# Patient Record
Sex: Female | Born: 2000 | Race: White | Hispanic: No | Smoking: Current some day smoker
Health system: Southern US, Community
[De-identification: ages and names within clinical notes are randomized; demographics above are authoritative.]

## PROBLEM LIST (undated history)

## (undated) DIAGNOSIS — O24419 Gestational diabetes mellitus in pregnancy, unspecified control: Secondary | ICD-10-CM

## (undated) HISTORY — DX: Gestational diabetes mellitus in pregnancy, unspecified control: O24.419

## (undated) HISTORY — PX: MOUTH SURGERY: SHX715

## (undated) HISTORY — PX: FRACTURE SURGERY: SHX138

## (undated) HISTORY — PX: WISDOM TOOTH EXTRACTION: SHX21

---
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
## 2018-03-07 ENCOUNTER — Encounter: Payer: Self-pay | Admitting: *Deleted

## 2018-03-07 ENCOUNTER — Ambulatory Visit (INDEPENDENT_AMBULATORY_CARE_PROVIDER_SITE_OTHER): Payer: Medicaid Other | Admitting: *Deleted

## 2018-03-07 ENCOUNTER — Ambulatory Visit: Payer: Medicaid Other

## 2018-03-07 ENCOUNTER — Other Ambulatory Visit: Payer: Self-pay | Admitting: Adult Health

## 2018-03-07 DIAGNOSIS — Z308 Encounter for other contraceptive management: Secondary | ICD-10-CM

## 2018-03-07 DIAGNOSIS — Z3042 Encounter for surveillance of injectable contraceptive: Secondary | ICD-10-CM

## 2018-03-07 DIAGNOSIS — Z3202 Encounter for pregnancy test, result negative: Secondary | ICD-10-CM

## 2018-03-07 LAB — POCT URINE PREGNANCY: PREG TEST UR: NEGATIVE

## 2018-03-07 MED ORDER — MEDROXYPROGESTERONE ACETATE 150 MG/ML IM SUSP
150.0000 mg | Freq: Once | INTRAMUSCULAR | Status: AC
  Administered 2018-03-07: 150 mg via INTRAMUSCULAR

## 2018-03-07 NOTE — Progress Notes (Signed)
Pt here for Depo. Pt tolerated shot well. Return in 12 weeks for next shot. JSY 

## 2018-05-20 ENCOUNTER — Ambulatory Visit: Payer: Medicaid Other

## 2018-05-20 ENCOUNTER — Telehealth: Payer: Self-pay | Admitting: *Deleted

## 2018-05-20 NOTE — Telephone Encounter (Signed)
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  Advised patient to see if she could have prescription transferred or pay out of pocket.  Patient stated she would call the pharmcy.

## 2019-06-30 IMAGING — US US BREAST*R* LIMITED INC AXILLA
1 series · 13 of 15 positions shown · non-contrast
Comparison: 01/05/2017

CLINICAL DATA: 15-year-old patient presents for evaluation of the
right breast. Possible mass palpated in the upper-outer quadrant of
the right breast on recent clinical physical exam. While in the
ultrasound room today, the patient shows me an area were she
palpates a lump in the 12 o'clock right breast.

She was evaluated at [HOSPITAL] in Wednesday December, 2016 for evaluation of
a benign cyst in the right breast 11 o'clock position that was
palpable at the time.
The patient denies any family history of breast cancer.
EXAM:
ULTRASOUND OF THE RIGHT BREAST

[Series 1: us breast*right* limited inc axilla · 0.07mm/px · 13 of 15 slices shown]
[im 1/15]
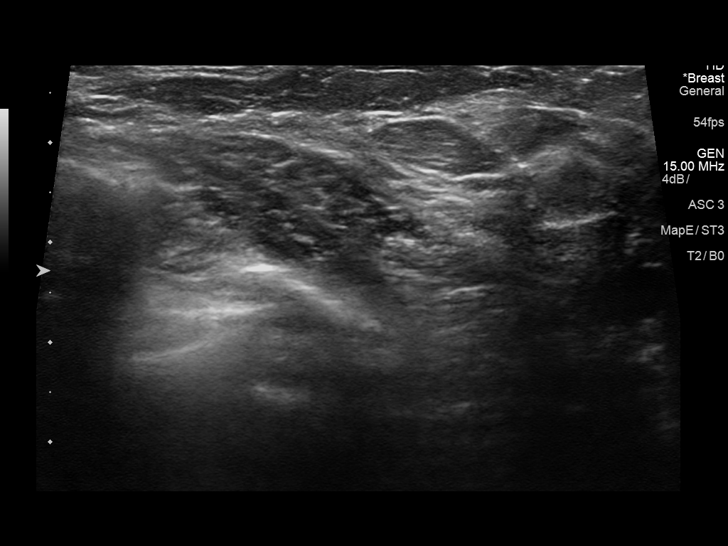
[im 2/15]
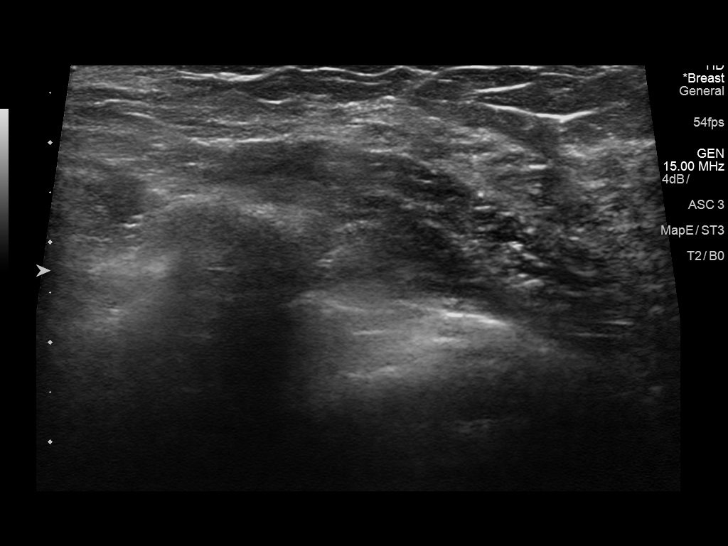
[im 3/15]
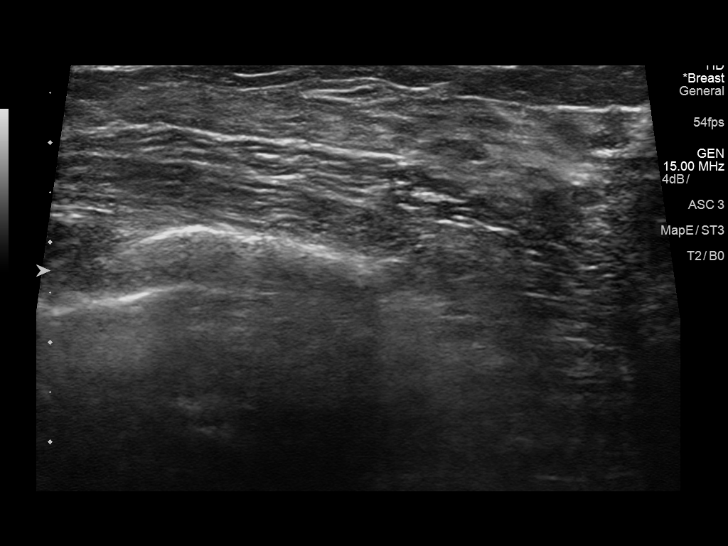
[im 5/15]
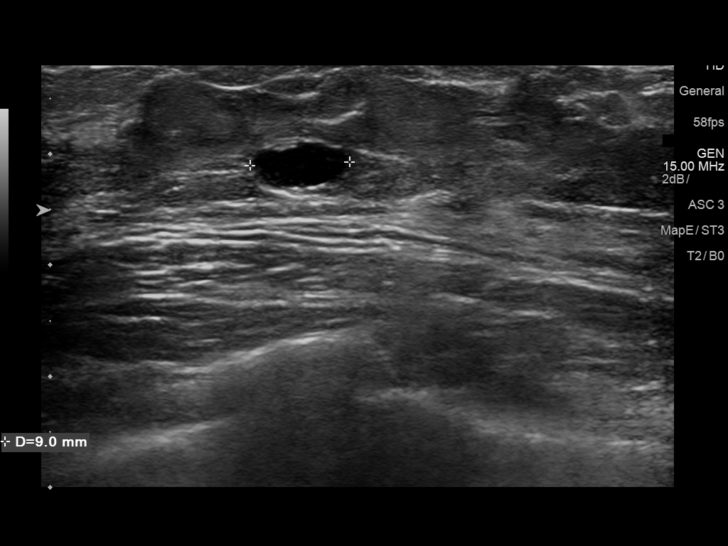
[im 6/15]
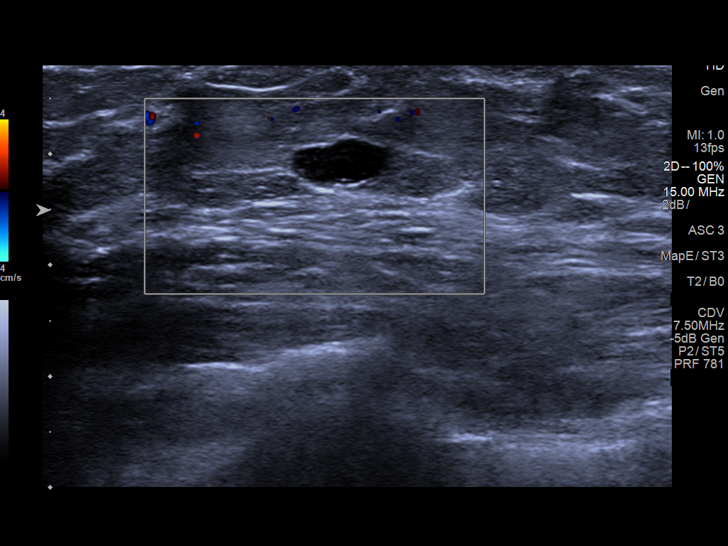
[im 7/15]
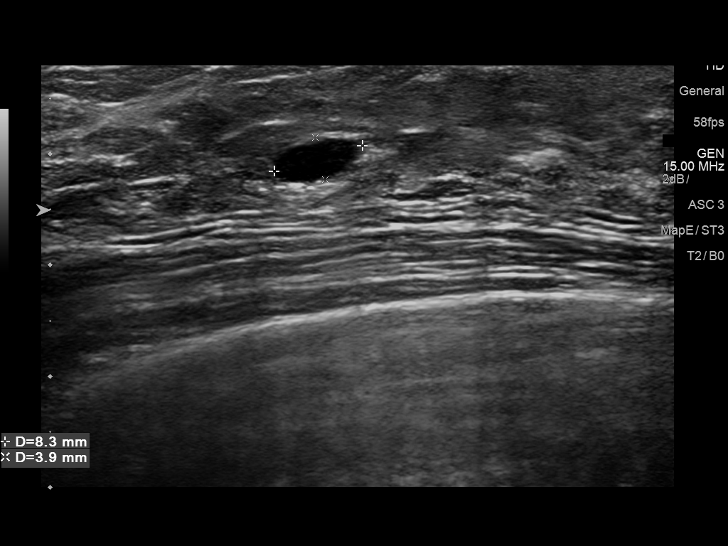
[im 8/15]
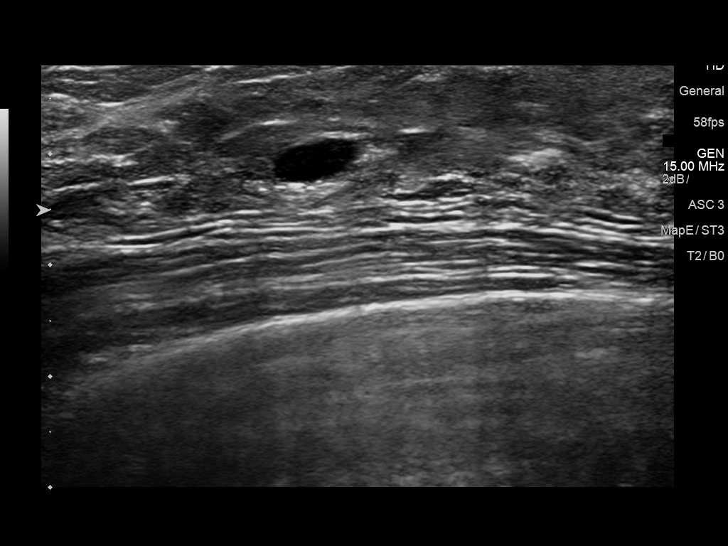
[im 9/15]
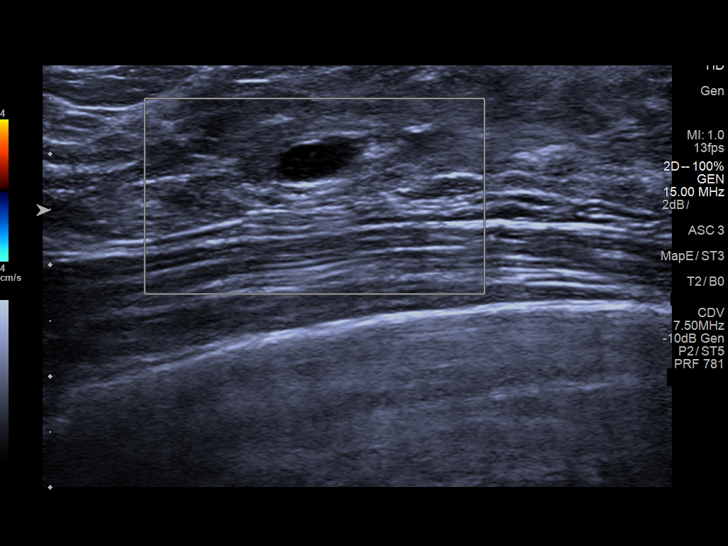
[im 10/15]
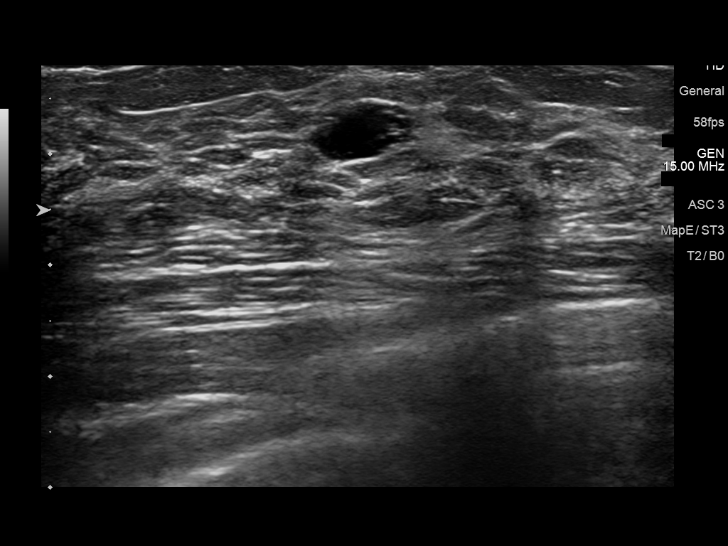
[im 11/15]
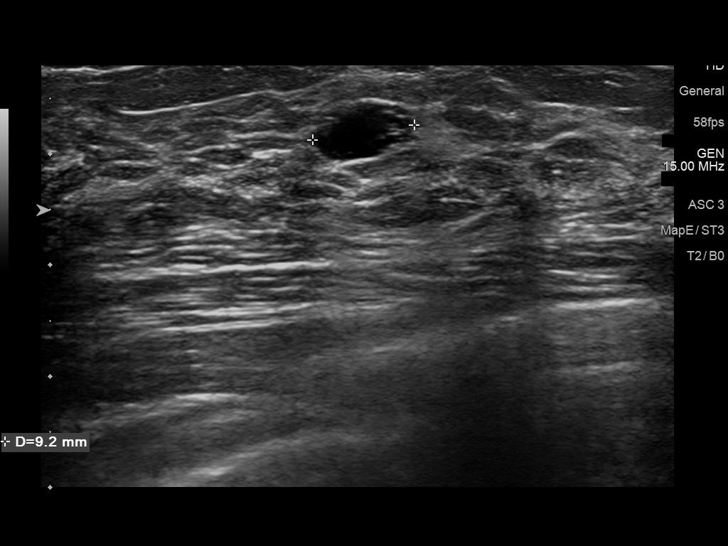
[im 13/15]
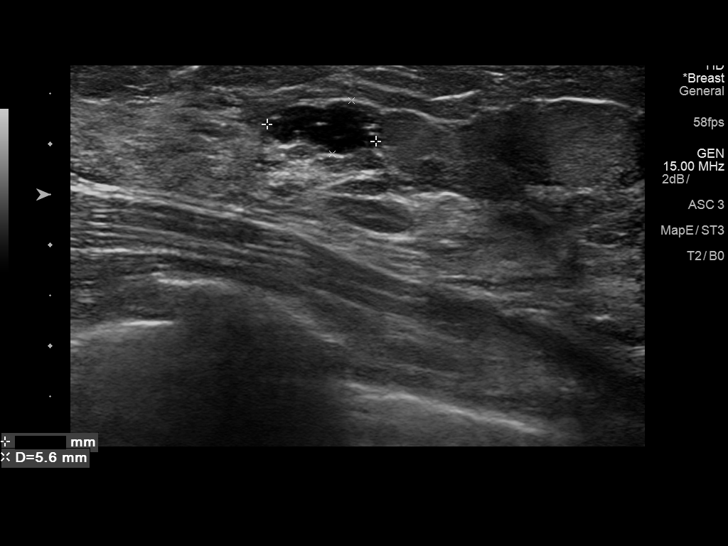
[im 14/15]
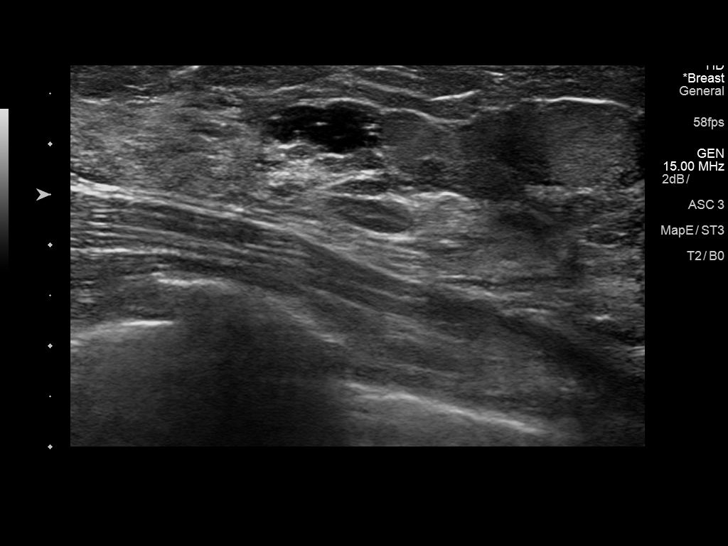
[im 15/15]
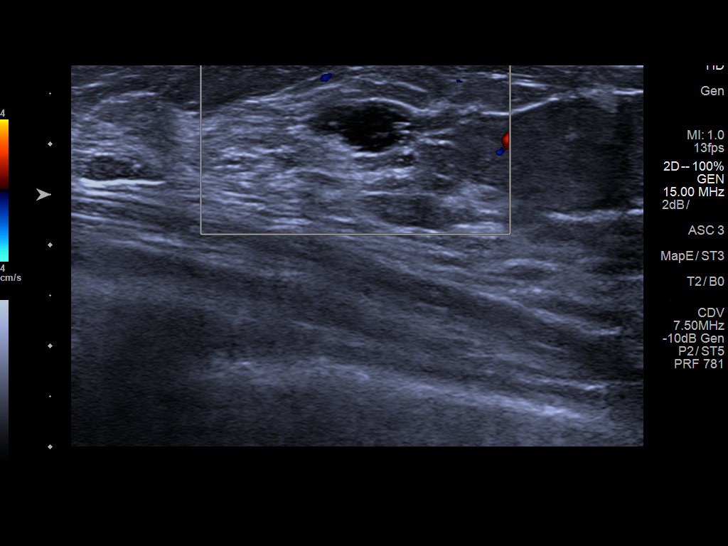

[13 of 15 positions shown; findings below may reference images not displayed]

FINDINGS: On physical exam, I do not palpate a definite discrete mass in the
upper outer or upper central right breast on physical exam today.

Targeted ultrasound is performed, showing a cyst with some internal
debris at 12 o'clock position 2 cm from the nipple measuring 1.1 x
0.6 x 0.9 cm. The patient palpates a nodule in this region. At 11
o'clock position 3 cm from the nipple is a 0.8 x 0.4 x 0.9 cm simple
cyst. Evaluation of the upper-outer quadrant of the right breast is
negative for cyst, solid mass, or abnormal shadowing.
IMPRESSION: No evidence of malignancy in the right breast. Two benign cysts at
12 o'clock and 11 o'clock positions.

RECOMMENDATION:
Screening mammogram at age 38 unless there are persistent or
intervening clinical concerns. (Code:AX-K-TDW)

I have discussed the findings and recommendations with the patient.
Results were also provided in writing at the conclusion of the
visit. If applicable, a reminder letter will be sent to the patient
regarding the next appointment.

BI-RADS CATEGORY  2: Benign.

## 2019-09-21 ENCOUNTER — Encounter (HOSPITAL_COMMUNITY): Payer: Self-pay | Admitting: Emergency Medicine

## 2019-09-21 ENCOUNTER — Other Ambulatory Visit: Payer: Self-pay

## 2019-09-21 ENCOUNTER — Emergency Department (HOSPITAL_COMMUNITY)
Admission: EM | Admit: 2019-09-21 | Discharge: 2019-09-21 | Disposition: A | Discharge status: Home / Self Care | Payer: Medicaid Other | Attending: Emergency Medicine | Admitting: Emergency Medicine

## 2019-09-21 DIAGNOSIS — F17290 Nicotine dependence, other tobacco product, uncomplicated: Secondary | ICD-10-CM | POA: Diagnosis not present

## 2019-09-21 DIAGNOSIS — Z79899 Other long term (current) drug therapy: Secondary | ICD-10-CM | POA: Insufficient documentation

## 2019-09-21 DIAGNOSIS — R350 Frequency of micturition: Secondary | ICD-10-CM | POA: Diagnosis present

## 2019-09-21 DIAGNOSIS — N760 Acute vaginitis: Secondary | ICD-10-CM | POA: Diagnosis not present

## 2019-09-21 DIAGNOSIS — B9689 Other specified bacterial agents as the cause of diseases classified elsewhere: Secondary | ICD-10-CM

## 2019-09-21 DIAGNOSIS — N3000 Acute cystitis without hematuria: Secondary | ICD-10-CM

## 2019-09-21 LAB — URINALYSIS, ROUTINE W REFLEX MICROSCOPIC
Bacteria, UA: NONE SEEN
Bilirubin Urine: NEGATIVE
Glucose, UA: NEGATIVE mg/dL
Hgb urine dipstick: NEGATIVE
Ketones, ur: NEGATIVE mg/dL
Nitrite: NEGATIVE
Protein, ur: NEGATIVE mg/dL
Specific Gravity, Urine: 1.016 (ref 1.005–1.030)
pH: 6 (ref 5.0–8.0)

## 2019-09-21 LAB — WET PREP, GENITAL
Sperm: NONE SEEN
Trich, Wet Prep: NONE SEEN
Yeast Wet Prep HPF POC: NONE SEEN

## 2019-09-21 LAB — PREGNANCY, URINE: Preg Test, Ur: NEGATIVE

## 2019-09-21 MED ORDER — NITROFURANTOIN MONOHYD MACRO 100 MG PO CAPS: 100.0000 mg | ORAL_CAPSULE | Freq: Two times a day (BID) | ORAL | 0 refills | Status: DC

## 2019-09-21 MED ORDER — METRONIDAZOLE 500 MG PO TABS: 500.0000 mg | ORAL_TABLET | Freq: Two times a day (BID) | ORAL | 0 refills | Status: DC

## 2019-09-21 MED ORDER — NITROFURANTOIN MONOHYD MACRO 100 MG PO CAPS
100.0000 mg | ORAL_CAPSULE | Freq: Once | ORAL | Status: AC
  Administered 2019-09-21: 100 mg via ORAL
  Filled 2019-09-21: qty 1

## 2019-09-21 NOTE — ED Triage Notes (Signed)
Pt states she "feels a very uncomfortable pressure" when she urinates x 1 week.

## 2019-09-21 NOTE — ED Provider Notes (Signed)
[facility] EMERGENCY DEPARTMENT Provider Note   CSN: [id number] Arrival date & time: 09/21/19  1191     History   Chief Complaint Chief Complaint  Patient presents with  . Urinary Pressure    HPI Meredith Griffin is a 18 y.o. female.     The history is provided by the patient.  Urinary Frequency This is a new problem. The current episode started more than 2 days ago. The problem occurs daily. The problem has been gradually worsening. Associated symptoms include abdominal pain. Exacerbated by: Urination. The symptoms are relieved by rest.   Patient presents with urinary frequency, and urinary pressure.  No fevers or vomiting.  She also reports she may have a lesion on her vagina.  No vaginal bleeding or discharge.  Past Surgical History:  Procedure Laterality Date  . FRACTURE SURGERY Right   . MOUTH SURGERY     teeth removed  . WISDOM TOOTH EXTRACTION       OB History    Gravida  0   Para  0   Term  0   Preterm  0   AB  0   Living  0     SAB  0   TAB  0   Ectopic  0   Multiple  0   Live Births  0            Home Medications    Prior to Admission medications   Medication Sig Start Date End Date Taking? Authorizing Provider  acetaminophen (TYLENOL) 325 MG tablet Take 650 mg by mouth as needed.    [provider]  ibuprofen (ADVIL,MOTRIN) 200 MG tablet Take 400 mg by mouth as needed.    [provider]  medroxyPROGESTERone Acetate 150 MG/ML SUSY INJECT 1 ML (150 MG TOTAL) INTO THE MUSCLE EVERY 3  MONTHS 03/07/18   Estill Dooms, NP    Family History Family History  Problem Relation Age of Onset  . Diabetes Father     Social History Social History   Tobacco Use  . Smoking status: Current Some Day Smoker    Types: E-cigarettes  . Smokeless tobacco: Never Used  Substance Use Topics  . Alcohol use: No  . Drug use: No     Allergies   Amoxil [amoxicillin]   Review of Systems Review of Systems   Gastrointestinal: Positive for abdominal pain.  Genitourinary: Positive for frequency. Negative for vaginal bleeding and vaginal discharge.  All other systems reviewed and are negative.    Physical Exam Updated Vital Signs BP (!) 142/87 (BP Location: Right Arm)   Pulse 97   Temp 98.6 F (37 C) (Oral)   Resp 18   Ht 1.524 m (5')   Wt 59.9 kg   SpO2 99%   BMI 25.78 kg/m   Physical Exam CONSTITUTIONAL: Well developed/well nourished HEAD: Normocephalic/atraumatic NECK: supple no meningeal signs SPINE/BACK:entire spine nontender CV: S1/S2 noted, no murmurs/rubs/gallops noted LUNGS: Lungs are clear to auscultation bilaterally, no apparent distress ABDOMEN: soft, nontender, no rebound or guarding, bowel sounds noted throughout abdomen GU:no cva tenderness, normal external genitalia.  No herpetic lesions, no abscesses.  No CMT.  Minimal whitish discharge, no vaginal bleeding.  Female chaperone present for exam NEURO: Pt is awake/alert/appropriate, moves all extremitiesx4.  No facial droop.   EXTREMITIES: pulses normal/equal, full ROM SKIN: warm, color normal PSYCH: no abnormalities of mood noted, alert and oriented to situation   ED Treatments / Results  Labs (all labs ordered are  listed, but only abnormal results are displayed) Labs Reviewed  WET PREP, GENITAL - Abnormal; Notable for the following components:      Result Value   Clue Cells Wet Prep HPF POC PRESENT (*)    WBC, Wet Prep HPF POC MODERATE (*)    All other components within normal limits  URINALYSIS, ROUTINE W REFLEX MICROSCOPIC - Abnormal; Notable for the following components:   APPearance HAZY (*)    Leukocytes,Ua SMALL (*)    All other components within normal limits  PREGNANCY, URINE  GC/CHLAMYDIA PROBE AMP ([place]) NOT AT [facility]    EKG None  Radiology No results found.  Procedures Procedures   Medications Ordered in ED Medications  nitrofurantoin (macrocrystal-monohydrate) (MACROBID) capsule  100 mg (has no administration in time range)     Initial Impression / Assessment and Plan / ED Course  I have reviewed the triage vital signs and the nursing notes.  Pertinent labs  results that were available during my care of the patient were reviewed by me and considered in my medical decision making (see chart for details).        Will treat for simple cystitis Pt well appearing Pt was concerned about lesions on vagina, but none seen on pelvic exam   We will treat for BV and UTI.  Patient otherwise stable for discharge home.  She is not septic appearing  Final Clinical Impressions(s) / ED Diagnoses   Final diagnoses:  Acute cystitis without hematuria  Bacterial vaginosis    ED Discharge Orders         Ordered    nitrofurantoin, macrocrystal-monohydrate, (MACROBID) 100 MG capsule  2 times daily     09/21/19 0551    metroNIDAZOLE (FLAGYL) 500 MG tablet  2 times daily     09/21/19 0601           Zadie Rhine, MD 09/21/19 [phone]

## 2019-09-25 LAB — GC/CHLAMYDIA PROBE AMP (~~LOC~~) NOT AT ARMC
Chlamydia: NEGATIVE
Neisseria Gonorrhea: NEGATIVE

## 2020-03-28 ENCOUNTER — Encounter (HOSPITAL_COMMUNITY): Payer: Self-pay

## 2020-03-28 ENCOUNTER — Emergency Department (HOSPITAL_COMMUNITY)
Admission: EM | Admit: 2020-03-28 | Discharge: 2020-03-29 | Disposition: A | Discharge status: Home / Self Care | Payer: Medicaid Other | Attending: Emergency Medicine | Admitting: Emergency Medicine

## 2020-03-28 ENCOUNTER — Other Ambulatory Visit: Payer: Self-pay

## 2020-03-28 DIAGNOSIS — R197 Diarrhea, unspecified: Secondary | ICD-10-CM | POA: Insufficient documentation

## 2020-03-28 DIAGNOSIS — O26891 Other specified pregnancy related conditions, first trimester: Secondary | ICD-10-CM | POA: Insufficient documentation

## 2020-03-28 DIAGNOSIS — Z3201 Encounter for pregnancy test, result positive: Secondary | ICD-10-CM | POA: Diagnosis not present

## 2020-03-28 DIAGNOSIS — R112 Nausea with vomiting, unspecified: Secondary | ICD-10-CM | POA: Diagnosis not present

## 2020-03-28 DIAGNOSIS — F17290 Nicotine dependence, other tobacco product, uncomplicated: Secondary | ICD-10-CM | POA: Diagnosis not present

## 2020-03-28 DIAGNOSIS — Z3A01 Less than 8 weeks gestation of pregnancy: Secondary | ICD-10-CM | POA: Diagnosis not present

## 2020-03-28 DIAGNOSIS — R42 Dizziness and giddiness: Secondary | ICD-10-CM | POA: Diagnosis not present

## 2020-03-28 NOTE — ED Triage Notes (Signed)
Pt reports nausea and diarrhea x 4 days, no vomiting.  Pt denies fevers or abd pain.

## 2020-03-29 LAB — POC URINE PREG, ED: Preg Test, Ur: POSITIVE — AB

## 2020-03-29 LAB — BASIC METABOLIC PANEL
Anion gap: 7 (ref 5–15)
BUN: 7 mg/dL (ref 6–20)
CO2: 24 mmol/L (ref 22–32)
Calcium: 9 mg/dL (ref 8.9–10.3)
Chloride: 104 mmol/L (ref 98–111)
Creatinine, Ser: 0.59 mg/dL (ref 0.44–1.00)
GFR calc Af Amer: >60 mL/min (ref >60)
GFR calc non Af Amer: >60 mL/min (ref >60)
Glucose, Bld: 88 mg/dL (ref 70–99)
Potassium: 3.6 mmol/L (ref 3.5–5.1)
Sodium: 135 mmol/L (ref 135–145)

## 2020-03-29 MED ORDER — DOXYLAMINE-PYRIDOXINE 10-10 MG PO TBEC: 2.0000 | DELAYED_RELEASE_TABLET | Freq: Every day | ORAL | 0 refills | Status: DC

## 2020-03-29 MED ORDER — LOPERAMIDE HCL 2 MG PO CAPS
4.0000 mg | ORAL_CAPSULE | Freq: Once | ORAL | Status: AC
  Administered 2020-03-29: 4 mg via ORAL
  Filled 2020-03-29: qty 2

## 2020-03-29 MED ORDER — ONDANSETRON 4 MG PO TBDP
4.0000 mg | ORAL_TABLET | Freq: Once | ORAL | Status: AC
  Administered 2020-03-29: 4 mg via ORAL
  Filled 2020-03-29: qty 1

## 2020-03-29 NOTE — Discharge Instructions (Addendum)
Drink plenty of fluids (clear liquids) then start a bland diet later this morning such as toast, crackers, jello, [employer] chicken noodle soup. Use the diclegis  for nausea or vomiting. Take imodium OTC for diarrhea. Avoid milk products until the diarrhea is gone.  Start taking prenatal vitamins with folic acid over-the-counter.  You should call a OB/GYN office to get a follow-up appointment for your pregnancy.  Recheck if you get abdominal pain, bleeding, or have uncontrolled vomiting.
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
## 2020-06-20 ENCOUNTER — Inpatient Hospital Stay (HOSPITAL_COMMUNITY): Payer: Medicaid Other

## 2020-06-20 ENCOUNTER — Inpatient Hospital Stay (HOSPITAL_COMMUNITY)
Admission: AD | Admit: 2020-06-20 | Discharge: 2020-06-20 | Disposition: A | Discharge status: Home / Self Care | Payer: Medicaid Other | Attending: Obstetrics and Gynecology | Admitting: Obstetrics and Gynecology

## 2020-06-20 ENCOUNTER — Other Ambulatory Visit: Payer: Self-pay

## 2020-06-20 ENCOUNTER — Encounter (HOSPITAL_COMMUNITY): Payer: Self-pay | Admitting: Obstetrics and Gynecology

## 2020-06-20 DIAGNOSIS — N760 Acute vaginitis: Secondary | ICD-10-CM | POA: Diagnosis not present

## 2020-06-20 DIAGNOSIS — Z880 Allergy status to penicillin: Secondary | ICD-10-CM | POA: Diagnosis not present

## 2020-06-20 DIAGNOSIS — F17290 Nicotine dependence, other tobacco product, uncomplicated: Secondary | ICD-10-CM | POA: Insufficient documentation

## 2020-06-20 DIAGNOSIS — Z79899 Other long term (current) drug therapy: Secondary | ICD-10-CM | POA: Diagnosis not present

## 2020-06-20 DIAGNOSIS — B9689 Other specified bacterial agents as the cause of diseases classified elsewhere: Secondary | ICD-10-CM | POA: Diagnosis not present

## 2020-06-20 DIAGNOSIS — O209 Hemorrhage in early pregnancy, unspecified: Secondary | ICD-10-CM

## 2020-06-20 LAB — CBC
HCT: 44.7 % (ref 36.0–46.0)
Hemoglobin: 15 g/dL (ref 12.0–15.0)
MCH: 30.7 pg (ref 26.0–34.0)
MCHC: 33.6 g/dL (ref 30.0–36.0)
MCV: 91.6 fL (ref 80.0–100.0)
Platelets: 346 10*3/uL (ref 150–400)
RBC: 4.88 MIL/uL (ref 3.87–5.11)
RDW: 11.9 % (ref 11.5–15.5)
WBC: 9.1 10*3/uL (ref 4.0–10.5)
nRBC: 0 % (ref 0.0–0.2)

## 2020-06-20 LAB — URINALYSIS, ROUTINE W REFLEX MICROSCOPIC
Bilirubin Urine: NEGATIVE
Glucose, UA: NEGATIVE mg/dL
Ketones, ur: NEGATIVE mg/dL
Nitrite: NEGATIVE
Protein, ur: NEGATIVE mg/dL
Specific Gravity, Urine: 1.01 (ref 1.005–1.030)
pH: 8 (ref 5.0–8.0)

## 2020-06-20 LAB — WET PREP, GENITAL
Sperm: NONE SEEN
Trich, Wet Prep: NONE SEEN
Yeast Wet Prep HPF POC: NONE SEEN

## 2020-06-20 LAB — POCT PREGNANCY, URINE: Preg Test, Ur: POSITIVE — AB

## 2020-06-20 LAB — HCG, QUANTITATIVE, PREGNANCY: hCG, Beta Chain, Quant, S: 4 m[IU]/mL (ref <5)

## 2020-06-20 LAB — ABO/RH: ABO/RH(D): O POS

## 2020-06-20 MED ORDER — METRONIDAZOLE 500 MG PO TABS: 500.0000 mg | ORAL_TABLET | Freq: Two times a day (BID) | ORAL | 0 refills | Status: DC

## 2020-06-20 NOTE — MAU Note (Signed)
Pt Was [redacted] week pregnant . Miscarried on Monday. Went To [facility]. Stated they did a procedure where they "sucked out " the pregnancy in the ED. Pt c/o increased pelvic pressure and brown vaginal bleeding with an odor and pain in her RLQ.
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
## 2020-06-20 NOTE — Discharge Instructions (Signed)
Dilation and Curettage or Vacuum Curettage, Care After These instructions give you information about caring for yourself after your procedure. Your doctor may also give you more specific instructions. Call your doctor if you have any problems or questions after your procedure. Follow these instructions at home: Activity  Do not drive or use heavy machinery while taking prescription pain medicine.  For 24 hours after your procedure, avoid driving.  Take short walks often, followed by rest periods. Ask your doctor what activities are safe for you. After one or two days, you may be able to return to your normal activities.  Do not lift anything that is heavier than 10 lb (4.5 kg) until your doctor approves.  For at least 2 weeks, or as long as told by your doctor: ? Do not douche. ? Do not use tampons. ? Do not have sex. General instructions   Take over-the-counter and prescription medicines only as told by your doctor. This is very important if you take blood thinning medicine.  Do not take baths, swim, or use a hot tub until your doctor approves. Take showers instead of baths.  Wear compression stockings as told by your doctor.  It is up to you to get the results of your procedure. Ask your doctor when your results will be ready.  Keep all follow-up visits as told by your doctor. This is important. Contact a doctor if:  You have very bad cramps that get worse or do not get better with medicine.  You have very bad pain in your belly (abdomen).  You cannot drink fluids without throwing up (vomiting).  You get pain in a different part of the area between your belly and thighs (pelvis).  You have bad-smelling discharge from your vagina.  You have a rash. Get help right away if:  You are bleeding a lot from your vagina. A lot of bleeding means soaking more than one sanitary pad in an hour, for 2 hours in a row.  You have clumps of blood (blood clots) coming from your  vagina.  You have a fever or chills.  Your belly feels very tender or hard.  You have chest pain.  You have trouble breathing.  You cough up blood.  You feel dizzy.  You feel light-headed.  You pass out (faint).  You have pain in your neck or shoulder area. Summary  Take short walks often, followed by rest periods. Ask your doctor what activities are safe for you. After one or two days, you may be able to return to your normal activities.  Do not lift anything that is heavier than 10 lb (4.5 kg) until your doctor approves.  Do not take baths, swim, or use a hot tub until your doctor approves. Take showers instead of baths.  Contact your doctor if you have any symptoms of infection, like bad-smelling discharge from your vagina. This information is not intended to replace advice given to you by your health care provider. Make sure you discuss any questions you have with your health care provider. Document Revised: 11/05/2017 Document Reviewed: 08/10/2016 Elsevier Patient Education  2020 [employer] Bacterial Vaginosis  Bacterial vaginosis is an infection of the vagina. It happens when too many normal germs (healthy bacteria) grow in the vagina. This infection puts you at risk for infections from sex (STIs). Treating this infection can lower your risk for some STIs. You should also treat this if you are pregnant. It can cause your baby to be born early. Follow  these instructions at home: Medicines  Take over-the-counter and prescription medicines only as told by your doctor.  Take or use your antibiotic medicine as told by your doctor. Do not stop taking or using it even if you start to feel better. General instructions  If you your sexual partner is a woman, tell her that you have this infection. She needs to get treatment if she has symptoms. If you have a female partner, he does not need to be treated.  During treatment: ? Avoid sex. ? Do not douche. ? Avoid alcohol as  told. ? Avoid breastfeeding as told.  Drink enough fluid to keep your pee (urine) clear or pale yellow.  Keep your vagina and butt (rectum) clean. ? Wash the area with warm water every day. ? Wipe from front to back after you use the toilet.  Keep all follow-up visits as told by your doctor. This is important. Preventing this condition  Do not douche.  Use only warm water to wash around your vagina.  Use protection when you have sex. This includes: ? Latex condoms. ? Dental dams.  Limit how many people you have sex with. It is best to only have sex with the same person (be monogamous).  Get tested for STIs. Have your partner get tested.  Wear underwear that is cotton or lined with cotton.  Avoid tight pants and pantyhose. This is most important in summer.  Do not use any products that have nicotine or tobacco in them. These include cigarettes and e-cigarettes. If you need help quitting, ask your doctor.  Do not use illegal drugs.  Limit how much alcohol you drink. Contact a doctor if:  Your symptoms do not get better, even after you are treated.  You have more discharge or pain when you pee (urinate).  You have a fever.  You have pain in your belly (abdomen).  You have pain with sex.  Your bleed from your vagina between periods. Summary  This infection happens when too many germs (bacteria) grow in the vagina.  Treating this condition can lower your risk for some infections from sex (STIs).  You should also treat this if you are pregnant. It can cause early (premature) birth.  Do not stop taking or using your antibiotic medicine even if you start to feel better. This information is not intended to replace advice given to you by your health care provider. Make sure you discuss any questions you have with your health care provider. Document Revised: 11/05/2017 Document Reviewed: 08/08/2016 Elsevier Patient Education  2020 [employer].

## 2020-06-21 LAB — GC/CHLAMYDIA PROBE AMP (~~LOC~~) NOT AT ARMC
Chlamydia: NEGATIVE
Comment: NEGATIVE
Comment: NORMAL
Neisseria Gonorrhea: NEGATIVE

## 2020-06-22 LAB — CULTURE, OB URINE

## 2021-12-07 NOTE — L&D Delivery Note (Signed)
OB/GYN Faculty Practice Delivery Note  Meredith Griffin is a 21 y.o. G3P1011 s/p VD at [redacted]w[redacted]d. She was admitted for IOL Pre-E w/o SF.   ROM: 0h 49m with clear fluid GBS Status:  Negative/-- (11/27 1530)  Labor Progress: Initial SVE: 4/50/ballotable. She then progressed to complete.   Delivery Date/Time: 11/10/22 1804 Delivery: Called to room and patient was complete and pushing. Head delivered LOA. No nuchal cord present, true knot noted x1. Shoulder and body delivered in usual fashion. Infant with spontaneous cry, placed on mother's abdomen, dried and stimulated. Cord clamped x 2 after 1-minute delay, and cut by FOB. Cord blood drawn. Placenta delivered spontaneously with gentle cord traction. Fundus firm with massage and Pitocin. Labia, perineum, vagina, and cervix inspected with bilateral periurethral tears that were hemostatic.  Baby Weight: pending  Placenta: 3 vessel, intact. Sent to L&D Complications: None Lacerations: b/l periurethral EBL: 52 mL Analgesia: Epidural   Infant:  APGAR (1 MIN):  8 APGAR (5 MINS):  9  Myrtie Hawk, DO OB Family Medicine Fellow, [facility] for [employer], [facility] 11/10/2022, 6:15 PM

## 2022-03-08 IMAGING — US US OB COMP LESS 14 WK
1 series · 15 of 22 positions shown · non-contrast
Comparison: 04/30/2020

CLINICAL DATA: Pelvic pressure, vaginal bleeding, recent
miscarriage and D and C, quantitative beta hCG 4

EXAM:
OBSTETRIC <14 WK ULTRASOUND
TECHNIQUE: Transabdominal ultrasound was performed for evaluation of the
gestation as well as the maternal uterus and adnexal regions.

[Series 1: us ob comp less 14 wk · 22 acquisitions, 15 frames shown]
[im 1/22]
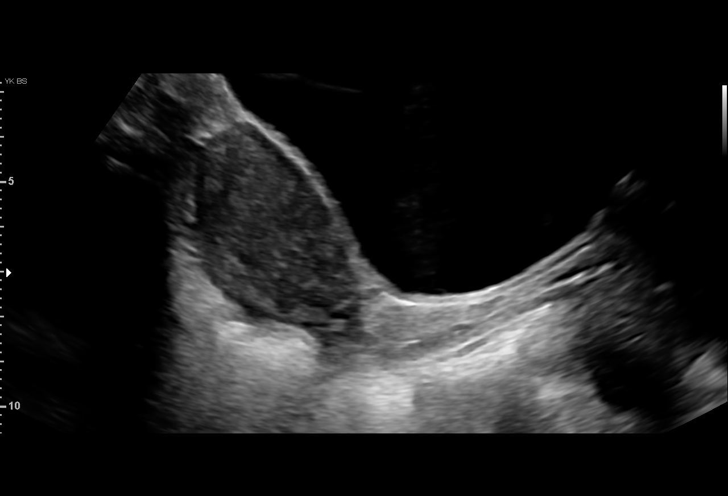
[im 3/22]
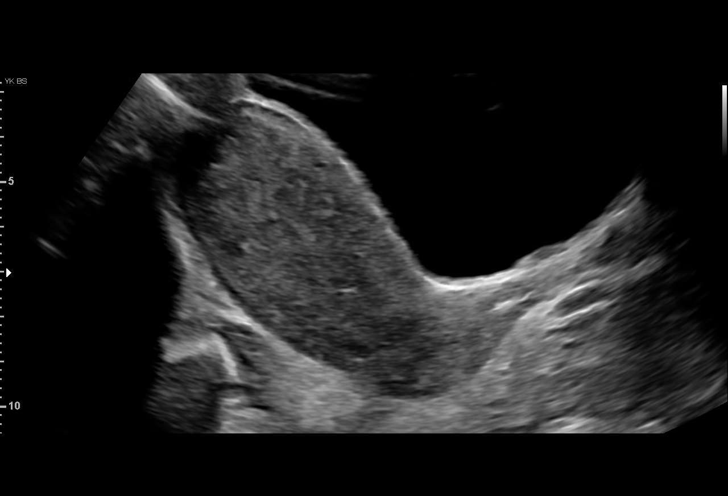
[im 4/22]
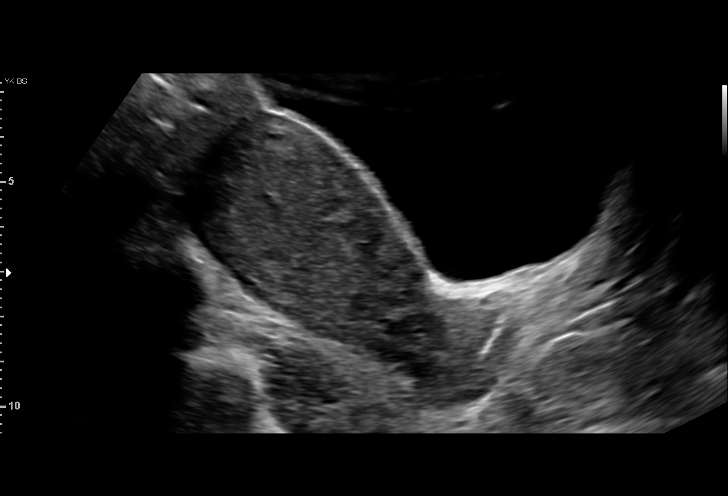
[im 6/22]
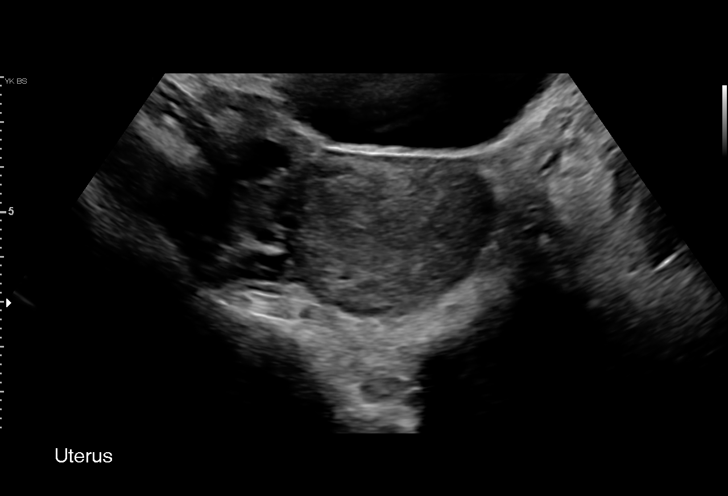
[im 7/22]
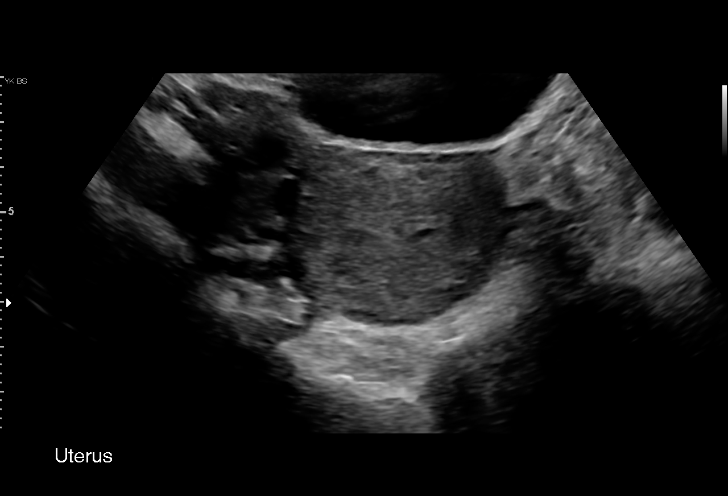
[im 9/22]
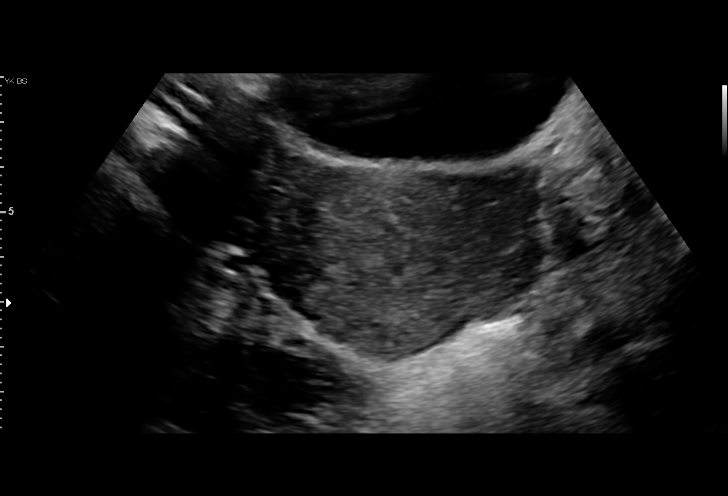
[im 10/22]
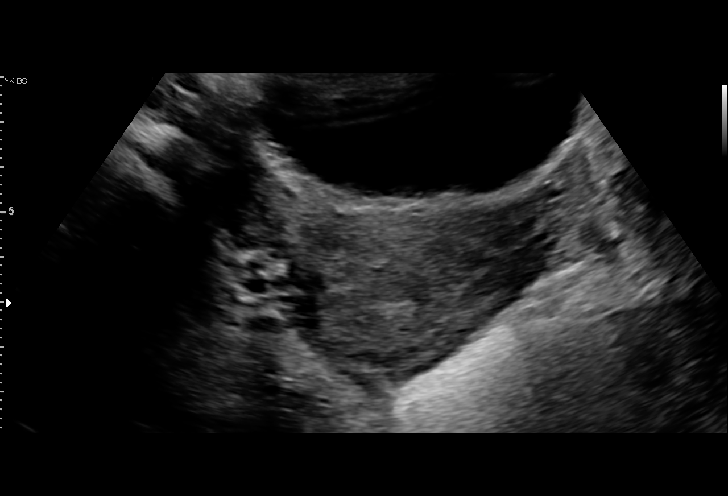
[im 12/22]
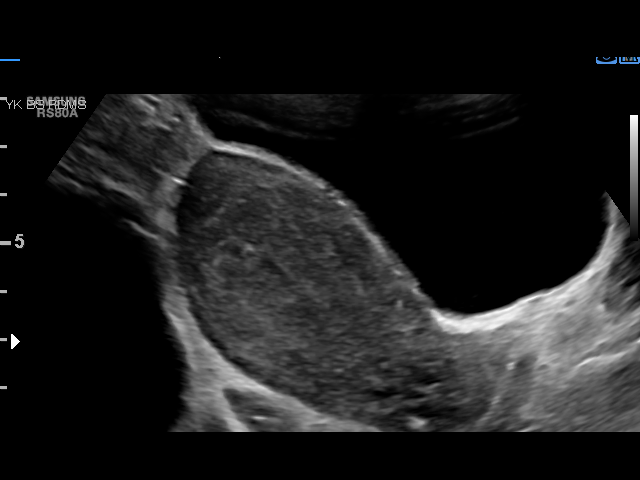
[im 13/22]
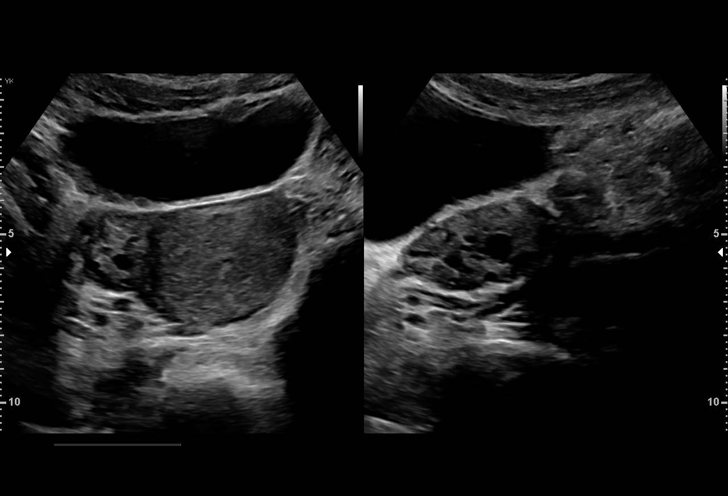
[im 14/22]
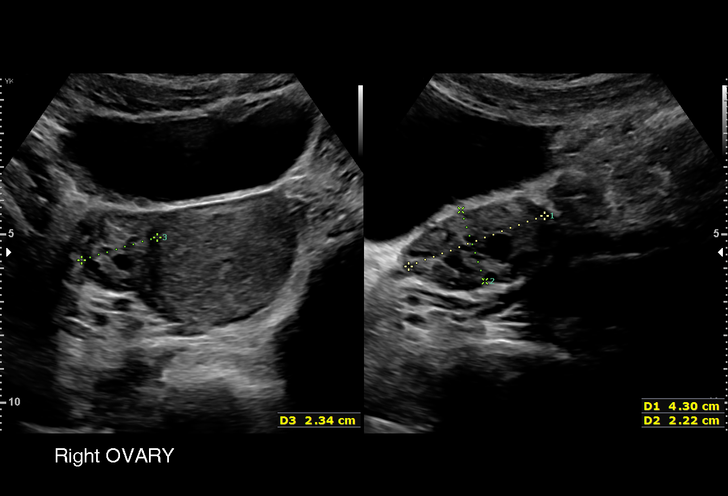
[im 16/22]
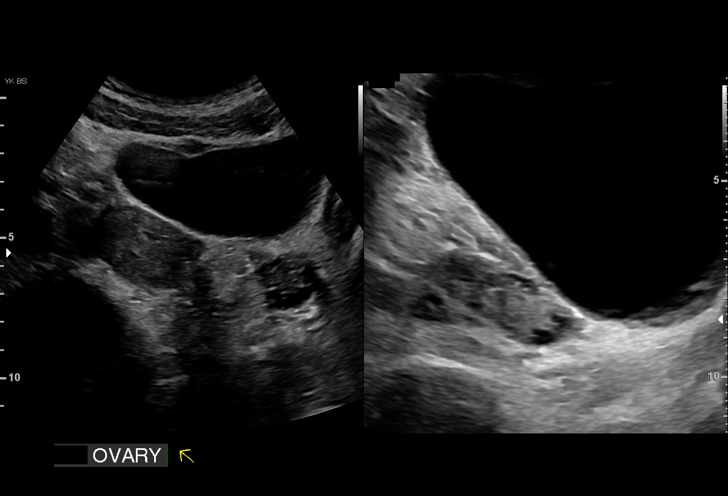
[im 17/22]
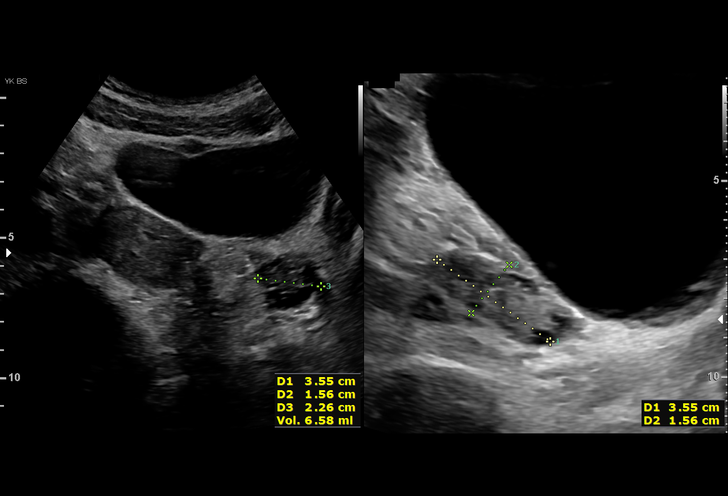
[im 19/22]
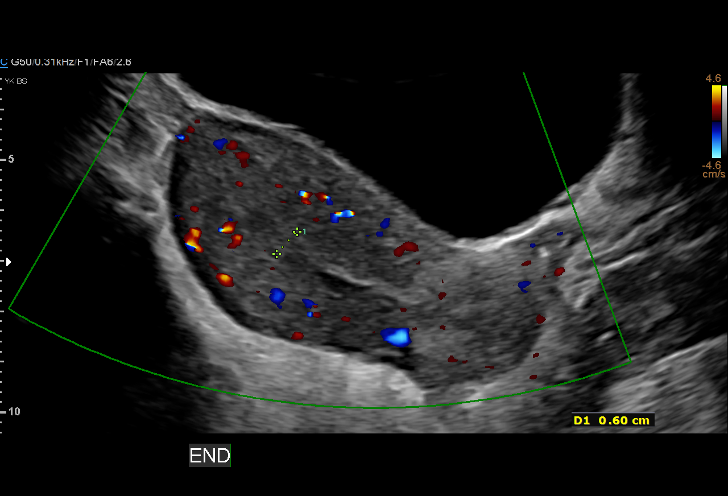
[im 20/22]
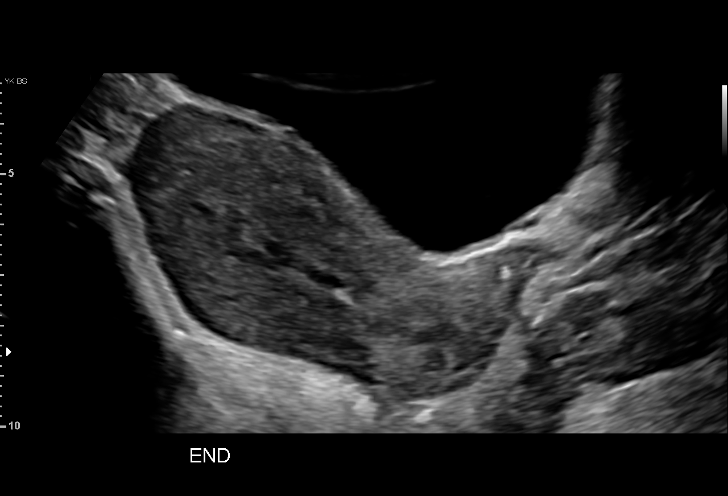
[im 22/22]
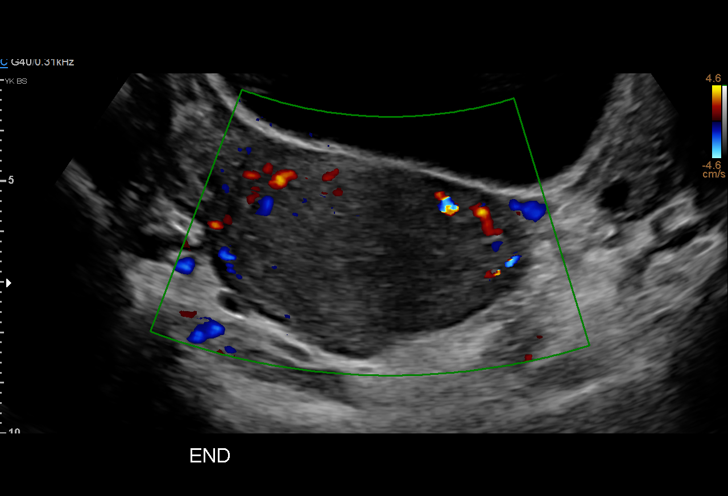

[15 of 22 positions shown; findings below may reference images not displayed]

FINDINGS: Intrauterine gestational sac: None

Yolk sac:  Not Visualized.

Embryo:  Not Visualized.

Maternal uterus/adnexae: Uterus is anteverted. Endometrium measures
6 mm. No evidence of retained products of conception.

Right ovary measures 4.3 x 2.2 x 2.3 cm and the left ovary measures
3.6 x 1.6 x 2.3 cm. No adnexal masses. No free fluid.
IMPRESSION: 1. No evidence of intrauterine pregnancy, consistent with recent
miscarriage and D and C. No retained products of conception.
2. Otherwise unremarkable exam.

## 2022-06-24 ENCOUNTER — Ambulatory Visit: Payer: Medicaid Other | Admitting: *Deleted

## 2022-06-24 ENCOUNTER — Ambulatory Visit (INDEPENDENT_AMBULATORY_CARE_PROVIDER_SITE_OTHER): Payer: Medicaid Other

## 2022-06-24 ENCOUNTER — Other Ambulatory Visit: Payer: Self-pay | Admitting: Obstetrics & Gynecology

## 2022-06-24 ENCOUNTER — Ambulatory Visit (INDEPENDENT_AMBULATORY_CARE_PROVIDER_SITE_OTHER): Payer: Medicaid Other | Admitting: Obstetrics & Gynecology

## 2022-06-24 ENCOUNTER — Other Ambulatory Visit (HOSPITAL_COMMUNITY)
Admission: RE | Admit: 2022-06-24 | Discharge: 2022-06-24 | Disposition: A | Discharge status: Home / Self Care | Payer: Medicaid Other | Source: Ambulatory Visit | Attending: Obstetrics & Gynecology | Admitting: Obstetrics & Gynecology

## 2022-06-24 ENCOUNTER — Encounter: Payer: Self-pay | Admitting: Obstetrics & Gynecology

## 2022-06-24 VITALS — BP 116/72 | HR 88 | Wt 137.0 lb

## 2022-06-24 DIAGNOSIS — Z124 Encounter for screening for malignant neoplasm of cervix: Secondary | ICD-10-CM | POA: Diagnosis present

## 2022-06-24 DIAGNOSIS — O3680X0 Pregnancy with inconclusive fetal viability, not applicable or unspecified: Secondary | ICD-10-CM

## 2022-06-24 DIAGNOSIS — Z363 Encounter for antenatal screening for malformations: Secondary | ICD-10-CM | POA: Diagnosis not present

## 2022-06-24 DIAGNOSIS — O0930 Supervision of pregnancy with insufficient antenatal care, unspecified trimester: Secondary | ICD-10-CM

## 2022-06-24 DIAGNOSIS — O09292 Supervision of pregnancy with other poor reproductive or obstetric history, second trimester: Secondary | ICD-10-CM

## 2022-06-24 DIAGNOSIS — Z3A17 17 weeks gestation of pregnancy: Secondary | ICD-10-CM

## 2022-06-24 DIAGNOSIS — Z3482 Encounter for supervision of other normal pregnancy, second trimester: Secondary | ICD-10-CM

## 2022-06-24 DIAGNOSIS — Z8632 Personal history of gestational diabetes: Secondary | ICD-10-CM

## 2022-06-24 DIAGNOSIS — Q369 Cleft lip, unilateral: Secondary | ICD-10-CM

## 2022-06-24 NOTE — Progress Notes (Signed)
INITIAL OBSTETRICAL VISIT Patient name: Meredith Griffin MRN [id number]  Date of birth: [birth year] Chief Complaint:   Initial Prenatal Visit  History of Present Illness:   Meredith Griffin is a 21 y.o. G68P1011 female at [redacted]w[redacted]d by [place] at 68 weeks with an Estimated Date of Delivery: 11/26/22 being seen today for her initial obstetrical visit.   Her obstetrical history is significant for h/o GDM Today she reports no complaints.     06/24/2022   10:06 AM 06/24/2022   10:05 AM 12/31/2016    3:32 PM  Depression screen PHQ 2/9  Decreased Interest 0 0 2  Down, Depressed, Hopeless 0 0 2  PHQ - 2 Score 0 0 4  Altered sleeping 1 1 2   Tired, decreased energy 1 1 3   Change in appetite 0 0 0  Feeling bad or failure about yourself  0 0 3  Trouble concentrating 0 0 3  Moving slowly or fidgety/restless 0 0 3  Suicidal thoughts  0 0  PHQ-9 Score 2 2 18     No LMP recorded. Patient is pregnant. Last pap not yet completed. Review of Systems:   Pertinent items are noted in HPI Denies cramping/contractions, leakage of fluid, vaginal bleeding, abnormal vaginal discharge w/ itching/odor/irritation, headaches, visual changes, shortness of breath, chest pain, abdominal pain, severe nausea/vomiting, or problems with urination or bowel movements unless otherwise stated above.  Pertinent History Reviewed:  Reviewed past medical,surgical, social, obstetrical and family history.  Reviewed problem list, medications and allergies. OB History  Gravida Para Term Preterm AB Living  3 1 1  0 1 1  SAB IAB Ectopic Multiple Live Births  1 0 0 0 1    # Outcome Date GA Lbr Len/2nd Weight Sex Delivery Anes PTL Lv  3 Current           2 Term 08/31/21 [redacted]w[redacted]d    Vag-Spont  N LIV  1 SAB            Physical Assessment:   Vitals:   06/24/22 1000  BP: 116/72  Pulse: 88  Weight: 137 lb (62.1 kg)  Body mass index is 25.06 kg/m.       Physical Examination:  General appearance - well appearing, and in no  distress  Mental status - alert, oriented to person, place, and time  Psych:  She has a normal mood and affect  Skin - warm and dry, normal color, no suspicious lesions noted  Chest - effort normal, all lung fields clear to auscultation bilaterally  Breast- no masses or tenderness, nipples unremarkable Heart - normal rate and regular rhythm  Abdomen - soft, nontender  Extremities:  No swelling or varicosities noted  Pelvic - VULVA: normal appearing vulva with no masses, tenderness or lesions  VAGINA: normal appearing vagina with normal color and discharge, no lesions  CERVIX: normal appearing cervix without discharge or lesions, no CMT  Thin prep pap is done with HR HPV cotesting  Chaperone:    09/02/21 today: breech,cx 2.9 cm,amnionic sludge,anterior placenta gr 0,normal ovaries,SVP 3.8 cm,FHR 159 bpm,left cleft lip  No results found for this or any previous visit (from the past 24 hour(s)).  Assessment & Plan:  1) Low-Risk Pregnancy G3P1011 at [redacted]w[redacted]d with an Estimated Date of Delivery: 11/26/22   2) Initial OB visit  3) 17wk scan- cleft lip noted, follow up Faith Rogue with MFM for detailed anatomy  Meds: No orders of the defined types were placed in this encounter.   Initial  labs obtained Continue prenatal vitamins Reviewed n/v relief measures and warning s/s to report Reviewed recommended weight gain based on pre-gravid BMI Encouraged well-balanced diet Genetic & carrier screening discussed: requests Panorama Ultrasound discussed; fetal survey: results reviewed CCNC completed> form faxed if has or is planning to apply for medicaid The nature of [place] [facility] [employer] with multiple MDs and other Advanced Practice Providers was explained to patient; also emphasized that fellows, residents, and students are part of our team.   Indications for ASA therapy (per uptodate) One of the following: H/O preeclampsia, especially early onset/adverse outcome  No Multifetal gestation No CHTN No T1DM or T2DM No Chronic kidney disease No Autoimmune disease (antiphospholipid syndrome, systemic lupus erythematosus) No  OR Two or more of the following: Nulliparity No Obesity (BMI>30 kg/m2) No Family h/o preeclampsia in mother or sister No Age ?35 years No Sociodemographic characteristics (African American race, low socioeconomic level) No Personal risk factors (eg, previous pregnancy w/ LBW or SGA, previous adverse pregnancy outcome [eg, stillbirth], interval >10 years between pregnancies) No  Follow-up: Return in about 4 weeks (around 07/22/2022) for LROB visit.   Orders Placed This Encounter  Procedures   GC/Chlamydia Probe Amp   Urine Culture   CBC/D/Plt+RPR+Rh+ABO+RubIgG...   HgB A1c   Panorama Prenatal Test Full Panel   AMB referral to maternal fetal medicine

## 2022-06-24 NOTE — Progress Notes (Signed)
[place] 17+6 wks,breech,cx 2.9 cm,amnionic sludge,anterior placenta gr 0,normal ovaries,SVP 3.8 cm,FHR 159 bpm,left cleft lip,limited view of heart,please have pt come back for additional images,EFW 205 g,EDD 11/26/2022 by today's ultrasound

## 2022-06-24 NOTE — Addendum Note (Signed)
Addended by: Annamarie Dawley on: 06/24/2022 11:50 AM   Modules accepted: Orders

## 2022-06-25 LAB — CYTOLOGY - PAP: Diagnosis: NEGATIVE

## 2022-06-25 LAB — CBC/D/PLT+RPR+RH+ABO+RUBIGG...
Antibody Screen: NEGATIVE
Basophils Absolute: 0 10*3/uL (ref 0.0–0.2)
Basos: 0 %
EOS (ABSOLUTE): 0.1 10*3/uL (ref 0.0–0.4)
Eos: 1 %
HCV Ab: NONREACTIVE
HIV Screen 4th Generation wRfx: NONREACTIVE
Hematocrit: 40.6 % (ref 34.0–46.6)
Hemoglobin: 13.9 g/dL (ref 11.1–15.9)
Hepatitis B Surface Ag: NEGATIVE
Immature Grans (Abs): 0.1 10*3/uL (ref 0.0–0.1)
Immature Granulocytes: 1 %
Lymphocytes Absolute: 1.8 10*3/uL (ref 0.7–3.1)
Lymphs: 25 %
MCH: 30.2 pg (ref 26.6–33.0)
MCHC: 34.2 g/dL (ref 31.5–35.7)
MCV: 88 fL (ref 79–97)
Monocytes Absolute: 0.3 10*3/uL (ref 0.1–0.9)
Monocytes: 5 %
Neutrophils Absolute: 5 10*3/uL (ref 1.4–7.0)
Neutrophils: 68 %
Platelets: 177 10*3/uL (ref 150–450)
RBC: 4.6 x10E6/uL (ref 3.77–5.28)
RDW: 12.2 % (ref 11.7–15.4)
RPR Ser Ql: NONREACTIVE
Rh Factor: POSITIVE
Rubella Antibodies, IGG: 1.98 index (ref >0.99)
WBC: 7.4 10*3/uL (ref 3.4–10.8)

## 2022-06-25 LAB — HCV INTERPRETATION

## 2022-06-25 LAB — HEMOGLOBIN A1C
Est. average glucose Bld gHb Est-mCnc: 94 mg/dL
Hgb A1c MFr Bld: 4.9 % (ref 4.8–5.6)

## 2022-06-26 ENCOUNTER — Encounter: Payer: Self-pay | Admitting: Obstetrics & Gynecology

## 2022-06-26 DIAGNOSIS — Z3480 Encounter for supervision of other normal pregnancy, unspecified trimester: Secondary | ICD-10-CM | POA: Insufficient documentation

## 2022-06-26 LAB — AFP, SERUM, OPEN SPINA BIFIDA
AFP MoM: 1.02
AFP Value: 46.1 ng/mL
Gest. Age on Collection Date: 17.6 weeks
Maternal Age At EDD: 23.2 yr
OSBR Risk 1 IN: 10000
Test Results:: NEGATIVE
Weight: 137 [lb_av]

## 2022-06-27 LAB — GC/CHLAMYDIA PROBE AMP
Chlamydia trachomatis, NAA: NEGATIVE
Neisseria Gonorrhoeae by PCR: NEGATIVE

## 2022-06-28 LAB — URINE CULTURE

## 2022-06-29 ENCOUNTER — Other Ambulatory Visit: Payer: Self-pay | Admitting: Obstetrics & Gynecology

## 2022-06-29 DIAGNOSIS — O2342 Unspecified infection of urinary tract in pregnancy, second trimester: Secondary | ICD-10-CM

## 2022-06-29 MED ORDER — NITROFURANTOIN MONOHYD MACRO 100 MG PO CAPS: 100.0000 mg | ORAL_CAPSULE | Freq: Two times a day (BID) | ORAL | 0 refills | Status: AC

## 2022-06-29 NOTE — Progress Notes (Signed)
Rx sent in for UTI.

## 2022-06-30 ENCOUNTER — Other Ambulatory Visit: Payer: Self-pay | Admitting: *Deleted

## 2022-06-30 DIAGNOSIS — O35AXX0 Maternal care for other (suspected) fetal abnormality and damage, fetal facial anomalies, not applicable or unspecified: Secondary | ICD-10-CM

## 2022-06-30 DIAGNOSIS — Z3480 Encounter for supervision of other normal pregnancy, unspecified trimester: Secondary | ICD-10-CM

## 2022-06-30 LAB — PANORAMA PRENATAL TEST FULL PANEL:PANORAMA TEST PLUS 5 ADDITIONAL MICRODELETIONS: FETAL FRACTION: 11.4

## 2022-07-08 ENCOUNTER — Ambulatory Visit: Payer: Medicaid Other | Admitting: *Deleted

## 2022-07-08 ENCOUNTER — Ambulatory Visit (HOSPITAL_BASED_OUTPATIENT_CLINIC_OR_DEPARTMENT_OTHER): Payer: Medicaid Other | Admitting: Obstetrics and Gynecology

## 2022-07-08 ENCOUNTER — Other Ambulatory Visit: Payer: Self-pay | Admitting: *Deleted

## 2022-07-08 ENCOUNTER — Ambulatory Visit: Payer: Medicaid Other | Attending: Obstetrics & Gynecology

## 2022-07-08 ENCOUNTER — Encounter: Payer: Self-pay | Admitting: Obstetrics & Gynecology

## 2022-07-08 ENCOUNTER — Encounter: Payer: Self-pay | Admitting: *Deleted

## 2022-07-08 VITALS — BP 124/76 | HR 93

## 2022-07-08 DIAGNOSIS — O0932 Supervision of pregnancy with insufficient antenatal care, second trimester: Secondary | ICD-10-CM | POA: Insufficient documentation

## 2022-07-08 DIAGNOSIS — Z363 Encounter for antenatal screening for malformations: Secondary | ICD-10-CM | POA: Diagnosis present

## 2022-07-08 DIAGNOSIS — O35AXX0 Maternal care for other (suspected) fetal abnormality and damage, fetal facial anomalies, not applicable or unspecified: Secondary | ICD-10-CM

## 2022-07-08 DIAGNOSIS — O283 Abnormal ultrasonic finding on antenatal screening of mother: Secondary | ICD-10-CM | POA: Insufficient documentation

## 2022-07-08 DIAGNOSIS — Z3480 Encounter for supervision of other normal pregnancy, unspecified trimester: Secondary | ICD-10-CM | POA: Diagnosis not present

## 2022-07-08 DIAGNOSIS — F17290 Nicotine dependence, other tobacco product, uncomplicated: Secondary | ICD-10-CM | POA: Insufficient documentation

## 2022-07-08 DIAGNOSIS — Z880 Allergy status to penicillin: Secondary | ICD-10-CM | POA: Insufficient documentation

## 2022-07-08 DIAGNOSIS — O09299 Supervision of pregnancy with other poor reproductive or obstetric history, unspecified trimester: Secondary | ICD-10-CM

## 2022-07-08 DIAGNOSIS — Z3A19 19 weeks gestation of pregnancy: Secondary | ICD-10-CM | POA: Insufficient documentation

## 2022-07-08 DIAGNOSIS — O09292 Supervision of pregnancy with other poor reproductive or obstetric history, second trimester: Secondary | ICD-10-CM | POA: Insufficient documentation

## 2022-07-08 DIAGNOSIS — Z8279 Family history of other congenital malformations, deformations and chromosomal abnormalities: Secondary | ICD-10-CM | POA: Insufficient documentation

## 2022-07-08 DIAGNOSIS — O99332 Smoking (tobacco) complicating pregnancy, second trimester: Secondary | ICD-10-CM | POA: Diagnosis not present

## 2022-07-08 NOTE — Progress Notes (Signed)
Maternal-Fetal Medicine   Name: Meredith Griffin DOB: [birth year] MRN: [id number] Referring Provider: Myna Hidalgo, DO  I had the pleasure of seeing Ms. Meredith [facility] at [facility]. She is G3 P1011 at 19w 6d gestation and is here for ultrasound and consultation.  At your office ultrasound, cleft for lip was suspected. On cell-free fetal DNA screening, the risks of fetal aneuploidies are not increased.  MSAFP screening showed low risk for open neural tube defects. She does not have any chronic medical conditions.  Her recent hemoglobin A1c was 4.9%. Obstetric history is significant for a term vaginal delivery.  Past surgical history: Wisdom tooth surgery. Medications: Prenatal vitamins.  No history of anticonvulsants or corticosteroids in early pregnancy. Allergies: Amoxicillin (rashes).   Social history: Patient smokes (vapes).  No alcohol or drug use.  Her partner is Caucasian and he is in good health. Family history: No history of venous thromboembolism in the family.  Ultrasound We performed fetal anatomical survey.  Amniotic fluid is normal and good fetal activity seen.  Fetal biometry is consistent with the previously established dates. Unilateral left sided cleft lip was seen.  Although I could not see evidence of cleft palate, it cannot be ruled out with certainty. Fetal profile and orbits appear normal.  Intracranial structures appear normal.  Rest of the fetal anatomical survey appears normal.  Fetal spine could not be visualized because of fetal position.  Unilateral Cleft Lip -I explained the findings with help of ultrasound (real-time and still images). -Unilateral cleft lip is present in about 75% of cases and cleft palate is present in about 75% of these cases. -About 2% to 7% of facial clefts are associated with genetic syndrome. -About 25% (15% to 40%) of cases have associated anomalies. -I reassured the patient of absence of other anomalies. However,  I informed her that ultrasound has limitations in detecting anomalies that could be missed. Cleft palate diagnosis can be challenging on ultrasound and may be evident only after birth. -Although cardiac anatomy appears normal, I recommended fetal echocardiography by pediatric cardiologists. We have requested an appointment [facility], [place]). -I discussed amniocentesis for fetal karyotype and some (not all) genetic syndromes that could be identified by microarray. I discussed the procedure and possible complications including miscarriage (1 in 500 procedures). I discussed cell-free fetal DNA screening and its limitations. -Patient would like to meet with our genetic counselor and decide on amniocentesis. -Patient will be referred to cleft team later in pregnancy (or earlier at her request) to discuss postnatal surgery.  -I briefly discussed neonatal management. Surgery is usually performed before 3 months in isolated cleft lip.  Recommendations -Genetic counseling ([employer]) tomorrow -We have requested an appointment for fetal echocardiography [facility], [place]). -An appointment was made for her to return in 4 weeks for completion of fetal anatomy. -Fetal growth assessments every 4 weeks till delivery. -We will make a referral to the cleft team in the third trimester.  Thank you for consultation.  If you have any questions or concerns, please contact me the [facility].  Consultation including face-to-face (more than 50%) counseling 30 minutes.

## 2022-07-09 ENCOUNTER — Ambulatory Visit: Payer: Self-pay

## 2022-07-09 ENCOUNTER — Ambulatory Visit: Payer: Medicaid Other | Attending: Obstetrics and Gynecology | Admitting: Obstetrics and Gynecology

## 2022-07-09 ENCOUNTER — Encounter: Payer: Self-pay | Admitting: Obstetrics and Gynecology

## 2022-07-09 ENCOUNTER — Other Ambulatory Visit: Payer: Self-pay

## 2022-07-09 DIAGNOSIS — O35AXX0 Maternal care for other (suspected) fetal abnormality and damage, fetal facial anomalies, not applicable or unspecified: Secondary | ICD-10-CM

## 2022-07-09 DIAGNOSIS — Z3A19 19 weeks gestation of pregnancy: Secondary | ICD-10-CM | POA: Diagnosis not present

## 2022-07-09 NOTE — Progress Notes (Addendum)
Virtual Visit via Video Note  I connected with Meredith Griffin on 07/09/22 at  2:00 PM EDT by a video enabled telemedicine application and verified that I am speaking with the correct person using two identifiers.  Location: Patient: home Provider: [facility] at [facility]   I discussed the limitations of evaluation and management by telemedicine and the availability of in person appointments. The patient expressed understanding and agreed to proceed.    Referring provider:  Dr. Nelda Marseille, [facility] Length of Consultation:45 minutes  Meredith Griffin was referred to [facility]  genetic counseling to review the result of her recent ultrasound.  She was present on this virtual visit alone.    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 The remainder of the fetal anatomy was seen and appeared normal. Though no evidence of a cleft palate was noted, we explained that it is not possible to rule out the chance for a cleft palate based on prenatal ultrasound. See the ultrasound report and MFM consultation note for details.  We reviewed that in normal embryological development, the fetal lip usually closes by 7-[redacted] weeks gestation and the fetal palate usually closes by [redacted] weeks gestation.  When parts of these structures do not fuse as they should, cleft lip and/or palate (CL/P) results.  CL/P is twice as common in males as it is in females. Approximately 25% (1 in 4) of all cleft lip and/or palate (CL/P) cases are cleft lip only, 50% (1 in 2) are cleft lip and palate, and 25% (1 in 4) are cleft palate only.  The incidence of CL/P varies in different ethnic populations; it occurs in approximately 1 in 25,000 Caucasian births and in approximately 1 in [place] births.  In addition to ethnicity, other factors  may increase the chance of a CL/P including some prenatal exposures, alcohol and drug use, cigarette smoking, or folic acid deficiency.  CL/P is most often an isolated condition, but can be present in combination with other birth defects as part of a genetic syndrome. Many genetic syndromes are associated with clefting including chromosome aneuploidy, microdeletions/duplications and single gene disorders. Approximately 7-13% of individuals with a cleft lip and 11-14% of individuals with a cleft lip and palate are born with additional birth defects.  Many genetic syndromes are associated with cleft lip and/or palate which may not be identified on ultrasound and would not be detected by amniocentesis.  For this reason, a genetics evaluation may be recommended after birth in order to assess for an underlying genetic syndrome.  When there is no syndrome as the cause, then the cleft lip or palate is thought to be caused by a combination of genetic and environmental factors, called multifactorial inheritance.    Studies report that once a couple has had one child with an isolated cleft lip and/or palate, the recurrence risk for each future pregnancy is approximately 3-7%.  The recurrence risk may change given a diagnosis of an underlying condition.     At the time of her visit, the possibility of arranging a prenatal contact with the cleft lip and palate team was discussed.  Surgery is required in all individuals with a cleft lip and/or palate.  Surgical correction is different for each individual and may vary depending on the severity of the cleft.  The prognosis for a good cosmetic and functional repair of an isolated CL/P is excellent.  Otherwise, prognosis is dependent upon the presence of other birth defects present or underlying condition identified.  The following prenatal screening and testing options for this pregnancy were discussed:   Prior Panorama testing was performed and was negative.  This  testing involves cell free fetal DNA testing from maternal blood to determine whether or not the baby may have either Down syndrome, trisomy 13, or trisomy 71.  This test utilizes a maternal blood sample and DNA sequencing technology to isolate circulating cell free fetal DNA from maternal plasma.  The fetal DNA can then be analyzed for DNA sequences that are derived from the three most common chromosomes involved in aneuploidy, chromosomes 13, 18, and 21.  If the overall amount of DNA is greater than the expected level for any of these chromosomes, aneuploidy is suspected.  While we do not consider it a replacement for invasive testing and karyotype analysis, a negative result from this testing would be reassuring, though not a guarantee of a normal chromosome complement for the baby.  An abnormal result is certainly suggestive of an abnormal chromosome complement, though we would still recommend CVS or amniocentesis to confirm any findings from this testing.  Amniocentesis was made available to assess for chromosome conditions in the pregnancy.  This procedure involves the removal of a small amount of amniotic fluid from the sac surrounding the fetus with the use of a thin needle inserted through the mother's abdomen and uterus.  Ultrasound guidance is used throughout the procedure.  Fetal cells from amniotic fluid are directly evaluated and >99.5% of chromosome conditions and >98% of open neural tube defects can be detected. This procedure is generally performed after the 15th week of pregnancy.  The main risks to this procedure include complications leading to miscarriage in less than 1 in 500 cases.  Additional testing for microdeletions and duplications can be performed through chromosomal microarray analysis.  It is important to remember that this cannot detect all genetic syndromes, and does not assess for single gene disorders.  Fetal echocardiogram is a specialized ultrasound to evaluate the fetal  heart.  This is typically performed after [redacted] weeks gestation and is done by a pediatric cardiologist.   We obtained a detailed family history, and the patient reported no family members with developmental delays, birth defects, cleft lip or palate, or known genetic conditions. We also obtained a detailed pregnancy history. Ms. Bernardi reported no complications thus far and denied the use of additional prescription medications, recreational drugs, tobacco or alcohol. She took Goody's headache powder before learning that she was pregnant. This is the third pregnancy for this couple.  They had one 14 week miscarriage and have a healthy 60 month old daughter.  It is important to remember that each pregnancy in the general population has a 3% chance for a birth defect that might not be detected prenatally. We recommend 0.4 mg of folic acid, a B-complex vitamin, for all women of childbearing age.  Folic acid may help to prevent neural tube defects and should be taken prior to and during pregnancy.  Folic acid supplementation may add some protective effect for cleft lip and/or palate.    Plan of Care: Ms. Hegel would like to pursue amniocentesis.  This is scheduled for Monday, 07/13/22, in our [place] office.  She would like to have FISH, karyotype and chromosomal microarray performed at that time. Carrier screening for cystic fibrosis (  CF), spinal muscular atrophy and hemoglobinopathies was also discussed.  The patient thought this was done in her last pregnancy, but we could not pull up those records from [facility] in [place].  Ms. Hurtado plans to check with that OB and would like to have screening if it was not done previously. Fetal echocardiogram has already been ordered by our MFM. Referral to Cleft lip and palate team at a tertiary care center can be arranged in the third trimester.  We encouraged the patient to contact us with any questions or concerns as they may arise.  We may be reached at  [phone].    I provided 45 minutes of non-face-to-face time during this encounter.   Katrina Stack

## 2022-07-13 ENCOUNTER — Ambulatory Visit: Payer: Medicaid Other

## 2022-07-13 ENCOUNTER — Ambulatory Visit: Payer: Medicaid Other | Attending: Obstetrics and Gynecology

## 2022-07-13 ENCOUNTER — Other Ambulatory Visit: Payer: Self-pay | Admitting: Obstetrics and Gynecology

## 2022-07-13 DIAGNOSIS — Z363 Encounter for antenatal screening for malformations: Secondary | ICD-10-CM

## 2022-07-22 ENCOUNTER — Ambulatory Visit (INDEPENDENT_AMBULATORY_CARE_PROVIDER_SITE_OTHER): Payer: Medicaid Other | Admitting: Advanced Practice Midwife

## 2022-07-22 VITALS — BP 130/77 | HR 118 | Wt 146.0 lb

## 2022-07-22 DIAGNOSIS — O35AXX0 Maternal care for other (suspected) fetal abnormality and damage, fetal facial anomalies, not applicable or unspecified: Secondary | ICD-10-CM

## 2022-07-22 DIAGNOSIS — Z8632 Personal history of gestational diabetes: Secondary | ICD-10-CM | POA: Insufficient documentation

## 2022-07-22 DIAGNOSIS — Z3A21 21 weeks gestation of pregnancy: Secondary | ICD-10-CM

## 2022-07-22 DIAGNOSIS — Z87440 Personal history of urinary (tract) infections: Secondary | ICD-10-CM

## 2022-07-22 DIAGNOSIS — Z3480 Encounter for supervision of other normal pregnancy, unspecified trimester: Secondary | ICD-10-CM

## 2022-07-22 MED ORDER — HYDROCORT-PRAMOXINE (PERIANAL) 1-1 % EX FOAM: 1.0000 | Freq: Two times a day (BID) | CUTANEOUS | 2 refills | Status: DC

## 2022-07-22 NOTE — Patient Instructions (Signed)
Franklyn Lor, I greatly value your feedback.  If you receive a survey following your visit with [place] today, we appreciate you taking the time to fill it out.  Thanks, Philipp Deputy, CNM   You will have your sugar test next visit.  Please do not eat or drink anything after midnight the night before you come, not even water.  You will be here for at least two hours.  Please make an appointment online for the bloodwork at [email or web address] for 8:30am (or as close to this as possible). Make sure you select the [facility] service center. The day of the appointment, check in with our office first, then you will go to [facility] to start the sugar test.    [facility] HAS MOVED!!! It is now [facility] ([address] [place], [place] [address]) Entrance C, located off of [address] valet parking  Go to [email or web address] to register for FREE online childbirth classes   Call the office [phone]) or go to [facility] if: You begin to have strong, frequent contractions Your water breaks.  Sometimes it is a big gush of fluid, sometimes it is just a trickle that keeps getting your panties wet or running down your legs You have vaginal bleeding.  It is normal to have a small amount of spotting if your cervix was checked.  You don't feel your baby moving like normal.  If you don't, get you something to eat and drink and lay down and focus on feeling your baby move.   If your baby is still not moving like normal, you should call the office or go to [facility].  [place] Pediatricians/Family Doctors: [facility] [phone]           [facility] [phone]                [facility] [phone] (usually not accepting new patients unless you have family there already, you are always welcome to call and ask)      [facility] Department [phone]       [facility] Pediatricians/Family Doctors:  [facility]: [phone] [facility]: [phone] [facility]: [phone]  [facility] Doctors:  [facility]: [phone]  [facility]: [phone]  [facility] Doctors: [facility]: [phone]   Home Blood Pressure Monitoring for Patients   Your provider has recommended that you check your blood pressure (BP) at least once a week at home. If you do not have a blood pressure cuff at home, one will be provided for you. Contact your provider if you have not received your monitor within 1 week.   Helpful Tips for Accurate Home Blood Pressure Checks  Don't smoke, exercise, or drink caffeine 30 minutes before checking your BP Use the restroom before checking your BP (a full bladder can raise your pressure) Relax in a comfortable upright chair Feet on the ground Left arm resting comfortably on a flat surface at the level of your heart Legs uncrossed Back supported Sit quietly and don't talk Place the cuff on your bare arm Adjust snuggly, so that only two fingertips can fit between your skin and the top of the cuff Check 2 readings separated by at least one minute Keep a log of your BP readings For a visual, please reference this diagram: http://ccnc.care/bpdiagram  Provider Name: [facility]     Phone: [phone]  Zone 1: ALL CLEAR  Continue to monitor your symptoms:  BP reading is less than 140 (top number) or less than 90 (bottom number)  No right upper stomach pain No headaches or seeing spots No feeling nauseated or throwing up No swelling in face and hands  Zone 2: CAUTION Call your doctor's office for any of the following:  BP reading is greater than 140 (top number) or greater than 90 (bottom number)  Stomach pain under your ribs in the middle or right side Headaches or seeing spots Feeling nauseated or throwing up Swelling in face and hands  Zone 3: EMERGENCY  Seek  immediate medical care if you have any of the following:  BP reading is greater than160 (top number) or greater than 110 (bottom number) Severe headaches not improving with Tylenol Serious difficulty catching your breath Any worsening symptoms from Zone 2   Second Trimester of Pregnancy The second trimester is from week 13 through week 28, months 4 through 6. The second trimester is often a time when you feel your best. Your body has also adjusted to being pregnant, and you begin to feel better physically. Usually, morning sickness has lessened or quit completely, you may have more energy, and you may have an increase in appetite. The second trimester is also a time when the fetus is growing rapidly. At the end of the sixth month, the fetus is about 9 inches long and weighs about 1 pounds. You will likely begin to feel the baby move (quickening) between 18 and 20 weeks of the pregnancy. BODY CHANGES Your body goes through many changes during pregnancy. The changes vary from woman to woman.  Your weight will continue to increase. You will notice your lower abdomen bulging out. You may begin to get stretch marks on your hips, abdomen, and breasts. You may develop headaches that can be relieved by medicines approved by your health care provider. You may urinate more often because the fetus is pressing on your bladder. You may develop or continue to have heartburn as a result of your pregnancy. You may develop constipation because certain hormones are causing the muscles that push waste through your intestines to slow down. You may develop hemorrhoids or swollen, bulging veins (varicose veins). You may have back pain because of the weight gain and pregnancy hormones relaxing your joints between the bones in your pelvis and as a result of a shift in weight and the muscles that support your balance. Your breasts will continue to grow and be tender. Your gums may bleed and may be sensitive to brushing  and flossing. Dark spots or blotches (chloasma, mask of pregnancy) may develop on your face. This will likely fade after the baby is born. A dark line from your belly button to the pubic area (linea nigra) may appear. This will likely fade after the baby is born. You may have changes in your hair. These can include thickening of your hair, rapid growth, and changes in texture. Some women also have hair loss during or after pregnancy, or hair that feels dry or thin. Your hair will most likely return to normal after your baby is born. WHAT TO EXPECT AT YOUR PRENATAL VISITS During a routine prenatal visit: You will be weighed to make sure you and the fetus are growing normally. Your blood pressure will be taken. Your abdomen will be measured to track your baby's growth. The fetal heartbeat will be listened to. Any test results from the previous visit will be discussed. Your health care provider  may ask you: How you are feeling. If you are feeling the baby move. If you have had any abnormal symptoms, such as leaking fluid, bleeding, severe headaches, or abdominal cramping. If you have any questions. Other tests that may be performed during your second trimester include: Blood tests that check for: Low iron levels (anemia). Gestational diabetes (between 24 and 28 weeks). Rh antibodies. Urine tests to check for infections, diabetes, or protein in the urine. An ultrasound to confirm the proper growth and development of the baby. An amniocentesis to check for possible genetic problems. Fetal screens for spina bifida and Down syndrome. HOME CARE INSTRUCTIONS  Avoid all smoking, herbs, alcohol, and unprescribed drugs. These chemicals affect the formation and growth of the baby. Follow your health care provider's instructions regarding medicine use. There are medicines that are either safe or unsafe to take during pregnancy. Exercise only as directed by your health care provider. Experiencing  uterine cramps is a good sign to stop exercising. Continue to eat regular, healthy meals. Wear a good support bra for breast tenderness. Do not use hot tubs, steam rooms, or saunas. Wear your seat belt at all times when driving. Avoid raw meat, uncooked cheese, cat litter boxes, and soil used by cats. These carry germs that can cause birth defects in the baby. Take your prenatal vitamins. Try taking a stool softener (if your health care provider approves) if you develop constipation. Eat more high-fiber foods, such as fresh vegetables or fruit and whole grains. Drink plenty of fluids to keep your urine clear or pale yellow. Take warm sitz baths to soothe any pain or discomfort caused by hemorrhoids. Use hemorrhoid cream if your health care provider approves. If you develop varicose veins, wear support hose. Elevate your feet for 15 minutes, 3-4 times a day. Limit salt in your diet. Avoid heavy lifting, wear low heel shoes, and practice good posture. Rest with your legs elevated if you have leg cramps or low back pain. Visit your dentist if you have not gone yet during your pregnancy. Use a soft toothbrush to brush your teeth and be gentle when you floss. A sexual relationship may be continued unless your health care provider directs you otherwise. Continue to go to all your prenatal visits as directed by your health care provider. SEEK MEDICAL CARE IF:  You have dizziness. You have mild pelvic cramps, pelvic pressure, or nagging pain in the abdominal area. You have persistent nausea, vomiting, or diarrhea. You have a bad smelling vaginal discharge. You have pain with urination. SEEK IMMEDIATE MEDICAL CARE IF:  You have a fever. You are leaking fluid from your vagina. You have spotting or bleeding from your vagina. You have severe abdominal cramping or pain. You have rapid weight gain or loss. You have shortness of breath with chest pain. You notice sudden or extreme swelling of your face,  hands, ankles, feet, or legs. You have not felt your baby move in over an hour. You have severe headaches that do not go away with medicine. You have vision changes. Document Released: 11/17/2001 Document Revised: 11/28/2013 Document Reviewed: 01/24/2013 [facility] Patient Information 2015 [place], [place]. This information is not intended to replace advice given to you by your health care provider. Make sure you discuss any questions you have with your health care provider.

## 2022-07-22 NOTE — Progress Notes (Signed)
   LOW-RISK PREGNANCY VISIT Patient name: Meredith Griffin MRN [id number]  Date of birth: Jul 22, 2001 Chief Complaint:   Routine Prenatal Visit  History of Present Illness:   Meredith Griffin is a 21 y.o. G35P1011 female at [redacted]w[redacted]d with an Estimated Date of Delivery: 11/26/22 being seen today for ongoing management of a low-risk pregnancy.  Today she reports  feeling well; missed amniocentesis appt 8/7 due to [employer] duties; having some blood from hemorrhoids . Contractions: Not present. Vag. Bleeding: None.  Movement: Present. denies leaking of fluid. Review of Systems:   Pertinent items are noted in HPI Denies abnormal vaginal discharge w/ itching/odor/irritation, headaches, visual changes, shortness of breath, chest pain, abdominal pain, severe nausea/vomiting, or problems with urination or bowel movements unless otherwise stated above. Pertinent History Reviewed:  Reviewed past medical,surgical, social, obstetrical and family history.  Reviewed problem list, medications and allergies. Physical Assessment:   Vitals:   07/22/22 1554  BP: 130/77  Pulse: (!) 118  Weight: 146 lb (66.2 kg)  Body mass index is 26.7 kg/m.        Physical Examination:   General appearance: Well appearing, and in no distress  Mental status: Alert, oriented to person, place, and time  Skin: Warm & dry  Cardiovascular: Normal heart rate noted  Respiratory: Normal respiratory effort, no distress  Abdomen: Soft, gravid, nontender  Pelvic: Cervical exam deferred         Extremities: Edema: None  Fetal Status: Fetal Heart Rate (bpm): 156   Movement: Present    No results found for this or any previous visit (from the past 24 hour(s)).  Assessment & Plan:  1) Low-risk pregnancy G3P1011 at [redacted]w[redacted]d with an Estimated Date of Delivery: 11/26/22   2) Fetus w L cleft lip, has next growth scan w MFM 8/30; fetal echo w [facility] 8/28; missed amniocentesis appt that was sched for 8/7> she will call MFM to reschedule  3)  Hemorrhoids, rx Proctofoam HC, rec stool softener/fluids to make stool less firm (not really having constipation)  4) Previous UTI, POC today   Meds:  Meds ordered this encounter  Medications   hydrocortisone-pramoxine (PROCTOFOAM-HC) rectal foam    Sig: Place 1 applicator rectally 2 (two) times daily.    Dispense:  10 g    Refill:  2    Order Specific Question:   Supervising Provider    Answer:   Lazaro Arms [[id number]]   Labs/procedures today: urine culture POC  Plan:  Continue routine obstetrical care   Reviewed: Preterm labor symptoms and general obstetric precautions including but not limited to vaginal bleeding, contractions, leaking of fluid and fetal movement were reviewed in detail with the patient.  All questions were answered. Didn't ask about home bp cuff. Check bp weekly, let us know if >140/90.   Follow-up: Return in about 30 days (around 08/21/2022) for PN2, LROB, in person.  Orders Placed This Encounter  Procedures   Urine Culture   Meredith Griffin [facility] 07/22/2022 4:35 PM

## 2022-07-24 LAB — URINE CULTURE

## 2022-07-24 LAB — SPECIMEN STATUS REPORT

## 2022-08-05 ENCOUNTER — Ambulatory Visit: Payer: Medicaid Other | Attending: Obstetrics and Gynecology

## 2022-08-05 ENCOUNTER — Ambulatory Visit: Payer: Medicaid Other

## 2022-08-20 ENCOUNTER — Ambulatory Visit (INDEPENDENT_AMBULATORY_CARE_PROVIDER_SITE_OTHER): Payer: Medicaid Other | Admitting: Advanced Practice Midwife

## 2022-08-20 ENCOUNTER — Encounter: Payer: Self-pay | Admitting: Advanced Practice Midwife

## 2022-08-20 ENCOUNTER — Other Ambulatory Visit: Payer: Medicaid Other

## 2022-08-20 VITALS — BP 123/74 | HR 99 | Wt 150.0 lb

## 2022-08-20 DIAGNOSIS — Z3A26 26 weeks gestation of pregnancy: Secondary | ICD-10-CM

## 2022-08-20 DIAGNOSIS — Z3480 Encounter for supervision of other normal pregnancy, unspecified trimester: Secondary | ICD-10-CM

## 2022-08-20 DIAGNOSIS — Z3A25 25 weeks gestation of pregnancy: Secondary | ICD-10-CM

## 2022-08-20 DIAGNOSIS — Z3402 Encounter for supervision of normal first pregnancy, second trimester: Secondary | ICD-10-CM

## 2022-08-20 DIAGNOSIS — Z131 Encounter for screening for diabetes mellitus: Secondary | ICD-10-CM

## 2022-08-20 NOTE — Patient Instructions (Addendum)
Meredith Griffin, I greatly value your feedback.  If you receive a survey following your visit with [place] today, we appreciate you taking the time to fill it out.  Thanks, Cathie Beams, CNM   [facility] HAS MOVED!!! It is now [facility] at [facility] ([address] [place], [place] [address]) Entrance located off of [address] Free 24/7 valet parking   Go to [email or web address] to register for FREE online childbirth classes    Call the office [phone]) or go to [facility] if: You begin to have strong, frequent contractions Your water breaks.  Sometimes it is a big gush of fluid, sometimes it is just a trickle that keeps getting your panties wet or running down your legs You have vaginal bleeding.  It is normal to have a small amount of spotting if your cervix was checked.  You don't feel your baby moving like normal.  If you don't, get you something to eat and drink and lay down and focus on feeling your baby move.  You should feel at least 10 movements in 2 hours.  If you don't, you should call the office or go to [facility].    Tdap Vaccine It is recommended that you get the Tdap vaccine during the third trimester of EACH pregnancy to help protect your baby from getting pertussis (whooping cough) 27-36 weeks is the BEST time to do this so that you can pass the protection on to your baby. During pregnancy is better than after pregnancy, but if you are unable to get it during pregnancy it will be offered at the hospital.  You will be offered this vaccine in the office after 27 weeks. If you do not have health insurance, you can get this vaccine at the health department or your family doctor Everyone who will be around your baby should also be up-to-date on their vaccines. Adults (who are not pregnant) only need 1 dose of Tdap during adulthood.   Third Trimester of Pregnancy The third trimester is from week 29 through week 42, months 7 through 9. The third  trimester is a time when the fetus is growing rapidly. At the end of the ninth month, the fetus is about 20 inches in length and weighs 6-10 pounds.  BODY CHANGES Your body goes through many changes during pregnancy. The changes vary from woman to woman.  Your weight will continue to increase. You can expect to gain 25-35 pounds (11-16 kg) by the end of the pregnancy. You may begin to get stretch marks on your hips, abdomen, and breasts. You may urinate more often because the fetus is moving lower into your pelvis and pressing on your bladder. You may develop or continue to have heartburn as a result of your pregnancy. You may develop constipation because certain hormones are causing the muscles that push waste through your intestines to slow down. You may develop hemorrhoids or swollen, bulging veins (varicose veins). You may have pelvic pain because of the weight gain and pregnancy hormones relaxing your joints between the bones in your pelvis. Backaches may result from overexertion of the muscles supporting your posture. You may have changes in your hair. These can include thickening of your hair, rapid growth, and changes in texture. Some women also have hair loss during or after pregnancy, or hair that feels dry or thin. Your hair will most likely return to normal after your baby is born. Your breasts will continue to grow and be tender. A  yellow discharge may leak from your breasts called colostrum. Your belly button may stick out. You may feel short of breath because of your expanding uterus. You may notice the fetus "dropping," or moving lower in your abdomen. You may have a bloody mucus discharge. This usually occurs a few days to a week before labor begins. Your cervix becomes thin and soft (effaced) near your due date. WHAT TO EXPECT AT YOUR PRENATAL EXAMS  You will have prenatal exams every 2 weeks until week 36. Then, you will have weekly prenatal exams. During a routine prenatal  visit: You will be weighed to make sure you and the fetus are growing normally. Your blood pressure is taken. Your abdomen will be measured to track your baby's growth. The fetal heartbeat will be listened to. Any test results from the previous visit will be discussed. You may have a cervical check near your due date to see if you have effaced. At around 36 weeks, your caregiver will check your cervix. At the same time, your caregiver will also perform a test on the secretions of the vaginal tissue. This test is to determine if a type of bacteria, Group B streptococcus, is present. Your caregiver will explain this further. Your caregiver may ask you: What your birth plan is. How you are feeling. If you are feeling the baby move. If you have had any abnormal symptoms, such as leaking fluid, bleeding, severe headaches, or abdominal cramping. If you have any questions. Other tests or screenings that may be performed during your third trimester include: Blood tests that check for low iron levels (anemia). Fetal testing to check the health, activity level, and growth of the fetus. Testing is done if you have certain medical conditions or if there are problems during the pregnancy. FALSE LABOR You may feel small, irregular contractions that eventually go away. These are called Braxton Hicks contractions, or false labor. Contractions may last for hours, days, or even weeks before true labor sets in. If contractions come at regular intervals, intensify, or become painful, it is best to be seen by your caregiver.  SIGNS OF LABOR  Menstrual-like cramps. Contractions that are 5 minutes apart or less. Contractions that start on the top of the uterus and spread down to the lower abdomen and back. A sense of increased pelvic pressure or back pain. A watery or bloody mucus discharge that comes from the vagina. If you have any of these signs before the 37th week of pregnancy, call your caregiver right away.  You need to go to the hospital to get checked immediately. HOME CARE INSTRUCTIONS  Avoid all smoking, herbs, alcohol, and unprescribed drugs. These chemicals affect the formation and growth of the baby. Follow your caregiver's instructions regarding medicine use. There are medicines that are either safe or unsafe to take during pregnancy. Exercise only as directed by your caregiver. Experiencing uterine cramps is a good sign to stop exercising. Continue to eat regular, healthy meals. Wear a good support bra for breast tenderness. Do not use hot tubs, steam rooms, or saunas. Wear your seat belt at all times when driving. Avoid raw meat, uncooked cheese, cat litter boxes, and soil used by cats. These carry germs that can cause birth defects in the baby. Take your prenatal vitamins. Try taking a stool softener (if your caregiver approves) if you develop constipation. Eat more high-fiber foods, such as fresh vegetables or fruit and whole grains. Drink plenty of fluids to keep your urine clear or pale yellow.  Take warm sitz baths to soothe any pain or discomfort caused by hemorrhoids. Use hemorrhoid cream if your caregiver approves. If you develop varicose veins, wear support hose. Elevate your feet for 15 minutes, 3-4 times a day. Limit salt in your diet. Avoid heavy lifting, wear low heal shoes, and practice good posture. Rest a lot with your legs elevated if you have leg cramps or low back pain. Visit your dentist if you have not gone during your pregnancy. Use a soft toothbrush to brush your teeth and be gentle when you floss. A sexual relationship may be continued unless your caregiver directs you otherwise. Do not travel far distances unless it is absolutely necessary and only with the approval of your caregiver. Take prenatal classes to understand, practice, and ask questions about the labor and delivery. Make a trial run to the hospital. Pack your hospital bag. Prepare the baby's  nursery. Continue to go to all your prenatal visits as directed by your caregiver. SEEK MEDICAL CARE IF: You are unsure if you are in labor or if your water has broken. You have dizziness. You have mild pelvic cramps, pelvic pressure, or nagging pain in your abdominal area. You have persistent nausea, vomiting, or diarrhea. You have a bad smelling vaginal discharge. You have pain with urination. SEEK IMMEDIATE MEDICAL CARE IF:  You have a fever. You are leaking fluid from your vagina. You have spotting or bleeding from your vagina. You have severe abdominal cramping or pain. You have rapid weight loss or gain. You have shortness of breath with chest pain. You notice sudden or extreme swelling of your face, hands, ankles, feet, or legs. You have not felt your baby move in over an hour. You have severe headaches that do not go away with medicine. You have vision changes. Document Released: 11/17/2001 Document Revised: 11/28/2013 Document Reviewed: 01/24/2013 [facility] Patient Information 2015 [place], [place]. This information is not intended to replace advice given to you by your health care provider. Make sure you discuss any questions you have with your health care provider.  [phone] is the phone number for Pregnancy Classes or hospital tours at [facility].   You will be referred to  [email or web address]   for more information on childbirth classes   At this site you may register for classes. You may sign up for a waiting list if classes are full. Please SIGN UP FOR THIS!.   When the waiting list becomes long, sometimes new classes can be added.  [facility] at [facility] Call to Register: [phone] or [phone]   or   Register Online: [email or web address] THESE CLASSES FILL UP VERY QUICKLY, SO SIGN UP AS SOON AS YOU CAN!!! Please visit [facility] pregnancy website  at [email or web address]  Childbirth Classes  Option 1: Birth & Baby Series Series of 3 weekly classes, on the same day of the week (can choose Mon-Thurs) from 6-9pm Helps you and your support person prepare for childbirth Reviews newborn care, labor & birth, cesarean birth, pain management, and comfort techniques Cost: $60 per couple for insured or self-pay, $30 per couple for Medicaid  Option 2: Weekend Birth & Baby This class is a weekend version of our Birth & Baby series.  It is designed for parents who have a difficult time fitting several weeks of classes into their schedule.   Covers the care of your newborn and the basics of labor and childbirth Friday 6:30pm-8:30pm Saturday 9am-4pm, includes lunch for you and your partner  Cost: $75  per couple for insured or self-pay, $30 per couple for Medicaid  Option 3: Natural Childbirth This series of 5 weekly classes is for expectant parents who want to learn and practice natural methods of coping with the process of labor and childbirth.  Can choose Mon or Tues, 7-9pm.   Covers relaxation, breathing, massage, visualization, role of the partner, and helpful positioning Participants learn how to be confident in their body's ability to give birth. Class empowers and helps parents make informed decisions about care. Includes discussion that will help new parents transition into the immediate postpartum period.  Cost: $75 per couple for insured or self-pay, $30 per couple for Medicaid  Option 4: Online Birth & Baby This online class offers you the freedom to complete a Birth & Baby series in the comfort of your own home.  The flexibility of this option allows you to review sections at your own pace, at times convenient to you and your support people.  It includes additional video information, animations, quizzes and extended activities. Get organized with helpful eClass tools, checklists, and trackers.  Cost: $60 for 60 days of online  access                                                                            Other Available Classes  Baby & Me Enjoy this time to discuss newborn & infant parenting topics and family adjustment issues with other new mothers in a relaxed environment. Each week brings a new speaker or baby-centered activity. We encourage mothers and their babies (birth to crawling) to join [place]. You are welcome to visit this group even if you haven't delivered yet! It's wonderful to make new friends early and watch other moms interact with their babies. No registration or fee.  Big Brother/Big Sister Let your children share in the joy of a new brother or sister in this special class designed just for them. Discussion includes how families care for babies: swaddling, holding, diapering, safety, as well as how they can be helpful in their new role. This class is designed for children ages 2 to 37, but any age is welcome. Please register each child individually. $5 Breastfeeding Support Group This group is a mother-to-mother support circle where moms have the opportunity to share their breastfeeding experiences. A Breastfeeding Support nurse is present for questions and concerns. An infant scale is available for weight checks. No fee or registration.  Breastfeeding Your Baby Breastfeeding is a special time for mother and child. This class will help you feel ready to begin this important relationship. Your partner is encouraged to attend with you. Learn what to expect and feel more confident in the first days of breastfeeding your newborn. This class also addresses the most common fears and challenges of breastfeeding during the first few weeks, months, and beyond. $30 per couple Caring for Baby This class is for expectant and adoptive parents who want to learn and practice the most up-to-date newborn care for their babies. Focus is on birth through first six weeks of life. Topics include feeding, bathing, diapering,  crying, umbilical cord care, circumcision care and safe sleep. Parents learn how to recognize symptoms of illness and when to call the pediatrician.  Register only the mom-to-be and your partner can plan to come with you. (*Note: This class is included in the Birth & Baby series and the Weekend Birth & Baby classes.) $10 per couple Comfort Techniques & Tour This 2-hour interactive class is designed for those who either do not wish to take the Birth & Baby series or for those who prefer our online childbirth class, but don't want to miss the opportunity to learn and practice hands-on techniques. These skills can help relieve some of the discomfort of labor and encourage your baby to rotate toward the best position for birth. You and your partner will be able to try a variety of labor positions with birth balls and rebozos as well as practice breathing, relaxation, and visual techniques. $20 per couple [employer] This course offers Dads-to-be the tools and knowledge needed to feel confident on their journey to becoming new fathers. Experienced dads, who have been trained as coaches, teach dads-to-be how to hold, comfort, diapers, swaddle and play with their infant while being able to support the new mom as well. $25 Grandparent Love Expecting a grandbaby? Learn about the latest infant care and safety recommendations and ways to support your own child as he or she transitions into the parenting role. $10 per person Infant and Child CPR Parents, grandparents, babysitters, and friends learn Cardio-Pulmonary Resuscitation skills for infants and children. You will also learn how to treat both conscious and unconscious choking infants and children. Register each participant individually. (Note: This Family & Friends program does not offer certification.) $20 per person Marvelous Multiples Expecting twins, triplets, or more? This free 2-hour class covers the differences in labor, birth, parenting, and  breastfeeding issues that face multiples' parents.  Maternity Care Center Virtual Tour  Online virtual tour of the new [facility] at [facility] Talk This free mom-led group offers support and connection to mothers as they journey through the adjustments and struggles of that sometimes overwhelming first year after the birth of a child. A member of our staff will be present to share resources and additional support if needed, as you care for yourself and baby. You are welcome to visit this group before you deliver! It's wonderful to meet new friends early and watch other moms interact with their babies.  Waterbirth Class Interested in a waterbirth? This free informational class will help you discover whether waterbirth is the right fit for you and is required if you are planning a waterbirth. Education about waterbirth itself, supplies you may need, and what you may need from your support team is included in this class. Partners are encouraged to come.

## 2022-08-20 NOTE — Progress Notes (Signed)
   LOW-RISK PREGNANCY VISIT Patient name: Meredith Griffin MRN [id number]  Date of birth: 08-Jul-2001 Chief Complaint:   Routine Prenatal Visit  History of Present Illness:   Meredith Griffin is a 21 y.o. G62P1011 female at [redacted]w[redacted]d with an Estimated Date of Delivery: 11/26/22 being seen today for ongoing management of a low-risk pregnancy.  Today she reports she didn't know to come early for Gtt.  . Contractions: Not present. Vag. Bleeding: None.  Movement: Present. denies leaking of fluid. Review of Systems:   Pertinent items are noted in HPI Denies abnormal vaginal discharge w/ itching/odor/irritation, headaches, visual changes, shortness of breath, chest pain, abdominal pain, severe nausea/vomiting, or problems with urination or bowel movements unless otherwise stated above. Pertinent History Reviewed:  Reviewed past medical,surgical, social, obstetrical and family history.  Reviewed problem list, medications and allergies. Physical Assessment:   Vitals:   08/20/22 1007  BP: 123/74  Pulse: 99  Weight: 150 lb (68 kg)  Body mass index is 27.44 kg/m.        Physical Examination:   General appearance: Well appearing, and in no distress  Mental status: Alert, oriented to person, place, and time  Skin: Warm & dry  Cardiovascular: Normal heart rate noted  Respiratory: Normal respiratory effort, no distress  Abdomen: Soft, gravid, nontender  Pelvic: Cervical exam deferred         Extremities: Edema: None  Fetal Status: Fetal Heart Rate (bpm): 160 Fundal Height: 26 cm Movement: Present    Chaperone: n/a    No results found for this or any previous visit (from the past 24 hour(s)).  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  Labs/procedures today:none  Plan:  Continue routine obstetrical care   Next visit: prefers in person    Reviewed: Preterm labor symptoms and general obstetric precautions including but not limited to vaginal bleeding, contractions, leaking of fluid and fetal movement were reviewed in detail with the patient.  All questions were answered. Has home bp cuff.. Check bp weekly, let us know if >140/90.   Follow-up: Return in about 4 weeks (around 09/17/2022) for LROB, within 2 weeks for gtt only.  No future appointments.  No orders of the defined types were placed in this encounter.  Jacklyn Shell DNP, CNM 08/20/2022 10:41 AM

## 2022-09-03 ENCOUNTER — Other Ambulatory Visit: Payer: Medicaid Other

## 2022-09-03 DIAGNOSIS — Z131 Encounter for screening for diabetes mellitus: Secondary | ICD-10-CM

## 2022-09-03 DIAGNOSIS — Z3480 Encounter for supervision of other normal pregnancy, unspecified trimester: Secondary | ICD-10-CM

## 2022-09-03 DIAGNOSIS — Z3A27 27 weeks gestation of pregnancy: Secondary | ICD-10-CM

## 2022-09-04 ENCOUNTER — Other Ambulatory Visit: Payer: Medicaid Other

## 2022-09-14 ENCOUNTER — Other Ambulatory Visit: Payer: Medicaid Other

## 2022-09-14 ENCOUNTER — Ambulatory Visit (INDEPENDENT_AMBULATORY_CARE_PROVIDER_SITE_OTHER): Payer: Medicaid Other | Admitting: Obstetrics and Gynecology

## 2022-09-14 ENCOUNTER — Encounter: Payer: Self-pay | Admitting: Obstetrics and Gynecology

## 2022-09-14 VITALS — BP 117/76 | HR 101 | Wt 150.0 lb

## 2022-09-14 DIAGNOSIS — Z131 Encounter for screening for diabetes mellitus: Secondary | ICD-10-CM

## 2022-09-14 DIAGNOSIS — Z8632 Personal history of gestational diabetes: Secondary | ICD-10-CM

## 2022-09-14 DIAGNOSIS — Z3A28 28 weeks gestation of pregnancy: Secondary | ICD-10-CM

## 2022-09-14 DIAGNOSIS — Z23 Encounter for immunization: Secondary | ICD-10-CM

## 2022-09-14 DIAGNOSIS — Z3A29 29 weeks gestation of pregnancy: Secondary | ICD-10-CM | POA: Diagnosis not present

## 2022-09-14 DIAGNOSIS — Z3480 Encounter for supervision of other normal pregnancy, unspecified trimester: Secondary | ICD-10-CM

## 2022-09-14 DIAGNOSIS — O35AXX0 Maternal care for other (suspected) fetal abnormality and damage, fetal facial anomalies, not applicable or unspecified: Secondary | ICD-10-CM

## 2022-09-14 NOTE — Progress Notes (Signed)
   PRENATAL VISIT NOTE  Subjective:  Meredith Griffin is a 21 y.o. G3P1011 at [redacted]w[redacted]d being seen today for ongoing prenatal care.  She is currently monitored for the following issues for this low-risk pregnancy and has Supervision of other normal pregnancy, antepartum; Cleft lip and palate, fetal, affecting care of mother, antepartum; and History of gestational diabetes mellitus (GDM) on their problem list.  Patient reports no complaints.  Contractions: Irritability. Vag. Bleeding: None.  Movement: Present. Denies leaking of fluid.   The following portions of the patient's history were reviewed and updated as appropriate: allergies, current medications, past family history, past medical history, past social history, past surgical history and problem list.   Objective:   Vitals:   09/14/22 1052  BP: 117/76  Pulse: (!) 101  Weight: 150 lb (68 kg)    Fetal Status: Fetal Heart Rate (bpm): 145 Fundal Height: 29 cm Movement: Present     General:  Alert, oriented and cooperative. Patient is in no acute distress.  Skin: Skin is warm and dry. No rash noted.   Cardiovascular: Normal heart rate noted  Respiratory: Normal respiratory effort, no problems with respiration noted  Abdomen: Soft, gravid, appropriate for gestational age.  Pain/Pressure: Absent     Pelvic: Cervical exam deferred        Extremities: Normal range of motion.  Edema: None  Mental Status: Normal mood and affect. Normal behavior. Normal judgment and thought content.   Assessment and Plan:  Pregnancy: G3P1011 at [redacted]w[redacted]d 1. History of gestational diabetes mellitus (GDM) Doing 2 hr today  2. Supervision of other normal pregnancy, antepartum Offered and recommended tdap today - pt accepts Offered flu shot - pt accepts  3. 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    Preterm labor symptoms and general obstetric precautions including but not limited to vaginal bleeding, contractions, leaking of fluid and fetal movement were reviewed in detail with the patient. Please refer to After Visit Summary for other counseling recommendations.   Return in about 2 weeks (around 09/28/2022) for OB VISIT, MD or APP, Schedule [place] with MFM.  No future appointments.  Radene Gunning, MD

## 2022-09-15 LAB — CBC
Hematocrit: 40.7 % (ref 34.0–46.6)
Hemoglobin: 13.7 g/dL (ref 11.1–15.9)
MCH: 29.7 pg (ref 26.6–33.0)
MCHC: 33.7 g/dL (ref 31.5–35.7)
MCV: 88 fL (ref 79–97)
Platelets: 188 10*3/uL (ref 150–450)
RBC: 4.61 x10E6/uL (ref 3.77–5.28)
RDW: 12 % (ref 11.7–15.4)
WBC: 9 10*3/uL (ref 3.4–10.8)

## 2022-09-15 LAB — GLUCOSE TOLERANCE, 2 HOURS W/ 1HR
Glucose, 1 hour: 171 mg/dL (ref 70–179)
Glucose, 2 hour: 111 mg/dL (ref 70–152)
Glucose, Fasting: 75 mg/dL (ref 70–91)

## 2022-09-15 LAB — ANTIBODY SCREEN: Antibody Screen: NEGATIVE

## 2022-09-15 LAB — RPR: RPR Ser Ql: NONREACTIVE

## 2022-09-15 LAB — HIV ANTIBODY (ROUTINE TESTING W REFLEX): HIV Screen 4th Generation wRfx: NONREACTIVE

## 2022-09-17 ENCOUNTER — Encounter: Payer: Medicaid Other | Admitting: Advanced Practice Midwife

## 2022-09-24 ENCOUNTER — Ambulatory Visit: Payer: Medicaid Other | Admitting: *Deleted

## 2022-09-24 ENCOUNTER — Ambulatory Visit: Payer: Medicaid Other | Attending: Obstetrics and Gynecology

## 2022-09-24 VITALS — BP 125/76 | HR 108

## 2022-09-24 DIAGNOSIS — Z3480 Encounter for supervision of other normal pregnancy, unspecified trimester: Secondary | ICD-10-CM

## 2022-09-24 DIAGNOSIS — O24113 Pre-existing diabetes mellitus, type 2, in pregnancy, third trimester: Secondary | ICD-10-CM | POA: Insufficient documentation

## 2022-09-24 DIAGNOSIS — O283 Abnormal ultrasonic finding on antenatal screening of mother: Secondary | ICD-10-CM

## 2022-09-24 DIAGNOSIS — Z8632 Personal history of gestational diabetes: Secondary | ICD-10-CM

## 2022-09-24 DIAGNOSIS — O0933 Supervision of pregnancy with insufficient antenatal care, third trimester: Secondary | ICD-10-CM

## 2022-09-24 DIAGNOSIS — Z3A31 31 weeks gestation of pregnancy: Secondary | ICD-10-CM | POA: Diagnosis not present

## 2022-09-24 DIAGNOSIS — O35AXX0 Maternal care for other (suspected) fetal abnormality and damage, fetal facial anomalies, not applicable or unspecified: Secondary | ICD-10-CM | POA: Diagnosis not present

## 2022-09-24 DIAGNOSIS — O09299 Supervision of pregnancy with other poor reproductive or obstetric history, unspecified trimester: Secondary | ICD-10-CM | POA: Diagnosis not present

## 2022-09-24 DIAGNOSIS — O09293 Supervision of pregnancy with other poor reproductive or obstetric history, third trimester: Secondary | ICD-10-CM | POA: Diagnosis not present

## 2022-09-24 NOTE — Progress Notes (Signed)
125 74Pt vomited several times last night and was " leaking" at the same time. She felt a gush in the tub and while on the couch today. She did not put a pad on. Says her undies are damp. She didn't tell her Dr. But waited til this appt. Today.

## 2022-09-25 ENCOUNTER — Other Ambulatory Visit: Payer: Self-pay | Admitting: *Deleted

## 2022-09-25 DIAGNOSIS — O3663X0 Maternal care for excessive fetal growth, third trimester, not applicable or unspecified: Secondary | ICD-10-CM

## 2022-10-01 ENCOUNTER — Ambulatory Visit (INDEPENDENT_AMBULATORY_CARE_PROVIDER_SITE_OTHER): Payer: Medicaid Other | Admitting: Obstetrics & Gynecology

## 2022-10-01 ENCOUNTER — Encounter: Payer: Self-pay | Admitting: Obstetrics & Gynecology

## 2022-10-01 VITALS — BP 134/94 | HR 128 | Wt 152.0 lb

## 2022-10-01 DIAGNOSIS — O35AXX0 Maternal care for other (suspected) fetal abnormality and damage, fetal facial anomalies, not applicable or unspecified: Secondary | ICD-10-CM

## 2022-10-01 DIAGNOSIS — Z3480 Encounter for supervision of other normal pregnancy, unspecified trimester: Secondary | ICD-10-CM

## 2022-10-01 DIAGNOSIS — Z3A32 32 weeks gestation of pregnancy: Secondary | ICD-10-CM

## 2022-10-01 LAB — POCT URINALYSIS DIPSTICK OB
Blood, UA: NEGATIVE
Glucose, UA: NEGATIVE
Ketones, UA: NEGATIVE
Nitrite, UA: NEGATIVE

## 2022-10-01 NOTE — Progress Notes (Signed)
[place] PREGNANCY VISIT Patient name: Meredith Griffin MRN [id number]  Date of birth: [birth year] Chief Complaint:   Routine Prenatal Visit  History of Present Illness:   Meredith Griffin is a 21 y.o. 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    Today she reports no complaints.  Pt supposed to be checking her sugars, which she is not yet doing.  Contractions: Irritability. Vag. Bleeding: None.  Movement: Present. denies leaking of fluid.      09/14/2022   11:08 AM 06/24/2022   10:06 AM 06/24/2022   10:05 AM 12/31/2016    3:32 PM  Depression screen PHQ 2/9  Decreased Interest 0 0 0 2  Down, Depressed, Hopeless 0 0 0 2  PHQ - 2 Score 0 0 0 4  Altered sleeping 0 1 1 2   Tired, decreased energy 0 1 1 3   Change in appetite 0 0 0 0  Feeling bad or failure about yourself  0 0 0 3  Trouble concentrating 0 0 0 3  Moving slowly or fidgety/restless 0 0 0 3  Suicidal thoughts 0  0 0  PHQ-9 Score 0 2 2 18      Current Outpatient Medications  Medication Instructions   Prenatal Vit-Fe Fumarate-FA (PRENATAL VITAMIN PO) Oral     Review of Systems:   Pertinent items are noted in HPI Denies abnormal vaginal discharge w/ itching/odor/irritation, headaches, visual changes, shortness of breath, chest pain, abdominal pain, severe nausea/vomiting, or problems with urination or bowel movements unless otherwise stated above. Pertinent History Reviewed:  Reviewed past medical,surgical, social, obstetrical and family history.  Reviewed problem list, medications and allergies. Physical Assessment:   Vitals:   10/01/22 1159  BP: (!) 134/94  Pulse: (!) 128  Weight: 152 lb (68.9 kg)  Body mass index is 27.8 kg/m. Repeat BP 130/89           Physical Examination:   General appearance: alert, well appearing, and in no distress  Mental  status: normal mood, behavior, speech, dress, motor activity, and thought processes  Skin: warm & dry   Extremities: Edema: Trace    Cardiovascular: normal heart rate noted  Respiratory: normal respiratory effort, no distress  Abdomen: gravid, soft, non-tender  Pelvic: Cervical exam deferred         Fetal Status: Fetal Heart Rate (bpm): 145 Fundal Height: 33 cm Movement: Present    Fetal Surveillance Testing today: doppler   Chaperone: N/A    Results for orders placed or performed in visit on 10/01/22 (from the past 24 hour(s))  POC Urinalysis Dipstick OB   Collection Time: 10/01/22 12:17 PM  Result Value Ref Range   Color, UA     Clarity, UA     Glucose, UA Negative Negative   Bilirubin, UA     Ketones, UA negative    Spec Grav, UA     Blood, UA negative    pH, UA     POC,PROTEIN,UA Small (1+) Negative, Trace, Small (1+), Moderate (2+), Large (3+), 4+   Urobilinogen, UA     Nitrite, UA negative    Leukocytes, UA Small (1+) (A) Negative   Appearance     Odor       Assessment & Plan:  High-risk pregnancy: G3P1011 at [redacted]w[redacted]d with an Estimated Date of Delivery: 11/26/22   1) Cleft lip -followed by  MFM, gorwth scan scheduled in Nov.   Meds: No orders of the defined types were placed in this encounter.   Labs/procedures today: none  Treatment Plan:  continue routine OB care  Reviewed: Preterm labor symptoms and general obstetric precautions including but not limited to vaginal bleeding, contractions, leaking of fluid and fetal movement were reviewed in detail with the patient.  All questions were answered. Pt has home bp cuff. Check bp weekly, let us know if >140/90.   Follow-up: Return in about 2 weeks (around 10/15/2022) for [place] visit.   Future Appointments  Date Time Provider [place]  10/15/2022  3:30 PM Christin Fudge, CNM [facility]  10/22/2022  2:15 PM [facility] NURSE [facility]  10/22/2022  2:30 PM [facility] US2 [facility] [place]    Orders  Placed This Encounter  Procedures   POC Urinalysis Dipstick OB    Janyth Pupa, DO Attending [place], [place] for [employer], [place]

## 2022-10-15 ENCOUNTER — Ambulatory Visit (INDEPENDENT_AMBULATORY_CARE_PROVIDER_SITE_OTHER): Payer: Medicaid Other | Admitting: Obstetrics & Gynecology

## 2022-10-15 VITALS — BP 119/80 | HR 94 | Wt 156.0 lb

## 2022-10-15 DIAGNOSIS — Z3483 Encounter for supervision of other normal pregnancy, third trimester: Secondary | ICD-10-CM

## 2022-10-15 DIAGNOSIS — Z3480 Encounter for supervision of other normal pregnancy, unspecified trimester: Secondary | ICD-10-CM

## 2022-10-15 DIAGNOSIS — Z3A34 34 weeks gestation of pregnancy: Secondary | ICD-10-CM

## 2022-10-15 DIAGNOSIS — O35AXX0 Maternal care for other (suspected) fetal abnormality and damage, fetal facial anomalies, not applicable or unspecified: Secondary | ICD-10-CM

## 2022-10-15 NOTE — Progress Notes (Signed)
   LOW-RISK PREGNANCY VISIT Patient name: Meredith Griffin MRN [id number]  Date of birth: [birth year] Chief Complaint:   Routine Prenatal Visit  History of Present Illness:   Meredith Griffin is a 21 y.o. G47P1011 female at [redacted]w[redacted]d with an Estimated Date of Delivery: 11/26/22 being seen today for ongoing management of a low-risk pregnancy.     09/14/2022   11:08 AM 06/24/2022   10:06 AM 06/24/2022   10:05 AM 12/31/2016    3:32 PM  Depression screen PHQ 2/9  Decreased Interest 0 0 0 2  Down, Depressed, Hopeless 0 0 0 2  PHQ - 2 Score 0 0 0 4  Altered sleeping 0 1 1 2   Tired, decreased energy 0 1 1 3   Change in appetite 0 0 0 0  Feeling bad or failure about yourself  0 0 0 3  Trouble concentrating 0 0 0 3  Moving slowly or fidgety/restless 0 0 0 3  Suicidal thoughts 0  0 0  PHQ-9 Score 0 2 2 18     Today she reports no complaints. Contractions: Irritability. Vag. Bleeding: None.  Movement: Present. denies leaking of fluid. Review of Systems:   Pertinent items are noted in HPI Denies abnormal vaginal discharge w/ itching/odor/irritation, headaches, visual changes, shortness of breath, chest pain, abdominal pain, severe nausea/vomiting, or problems with urination or bowel movements unless otherwise stated above. Pertinent History Reviewed:  Reviewed past medical,surgical, social, obstetrical and family history.  Reviewed problem list, medications and allergies. Physical Assessment:   Vitals:   10/15/22 1152  BP: 119/80  Pulse: 94  Weight: 156 lb (70.8 kg)  Body mass index is 28.53 kg/m.        Physical Examination:   General appearance: Well appearing, and in no distress  Mental status: Alert, oriented to person, place, and time  Skin: Warm & dry  Cardiovascular: Normal heart rate noted  Respiratory: Normal respiratory effort, no distress  Abdomen: Soft, gravid, nontender  Pelvic: Cervical exam deferred         Extremities: Edema: Trace  Fetal Status:     Movement: Present     Chaperone: n/a    No results found for this or any previous visit (from the past 24 hour(s)).  Assessment & Plan:  1) Low-risk pregnancy G3P1011 at [redacted]w[redacted]d with an Estimated Date of Delivery: 11/26/22   2) Cleft lip/?palate, EFW next week with MFM 10/22/22 2:30 pm   Meds: No orders of the defined types were placed in this encounter.  Labs/procedures today: none  Plan:  Continue routine obstetrical care  Next visit: prefers in person    Reviewed: Preterm labor symptoms and general obstetric precautions including but not limited to vaginal bleeding, contractions, leaking of fluid and fetal movement were reviewed in detail with the patient.  All questions were answered. Has home bp cuff. Rx faxed to . Check bp weekly, let [redacted]w[redacted]d know if >140/90.   Follow-up: No follow-ups on file.  No orders of the defined types were placed in this encounter.   11/28/22, MD 10/15/2022 12:06 PM

## 2022-10-22 ENCOUNTER — Other Ambulatory Visit: Payer: Self-pay | Admitting: Maternal & Fetal Medicine

## 2022-10-22 ENCOUNTER — Ambulatory Visit: Payer: Medicaid Other | Admitting: *Deleted

## 2022-10-22 ENCOUNTER — Ambulatory Visit: Payer: Medicaid Other | Attending: Maternal & Fetal Medicine

## 2022-10-22 VITALS — BP 124/82 | HR 88

## 2022-10-22 DIAGNOSIS — O0933 Supervision of pregnancy with insufficient antenatal care, third trimester: Secondary | ICD-10-CM

## 2022-10-22 DIAGNOSIS — O09293 Supervision of pregnancy with other poor reproductive or obstetric history, third trimester: Secondary | ICD-10-CM | POA: Diagnosis not present

## 2022-10-22 DIAGNOSIS — O283 Abnormal ultrasonic finding on antenatal screening of mother: Secondary | ICD-10-CM

## 2022-10-22 DIAGNOSIS — Z8632 Personal history of gestational diabetes: Secondary | ICD-10-CM | POA: Diagnosis present

## 2022-10-22 DIAGNOSIS — Z3A35 35 weeks gestation of pregnancy: Secondary | ICD-10-CM

## 2022-10-22 DIAGNOSIS — O3663X0 Maternal care for excessive fetal growth, third trimester, not applicable or unspecified: Secondary | ICD-10-CM | POA: Diagnosis present

## 2022-10-22 DIAGNOSIS — O403XX0 Polyhydramnios, third trimester, not applicable or unspecified: Secondary | ICD-10-CM | POA: Diagnosis not present

## 2022-10-22 NOTE — Procedures (Signed)
Meredith Griffin 12-Jun-2001 [redacted]w[redacted]d  Fetus A Non-Stress Test Interpretation for 10/22/22  Indication: Unsatisfactory BPP  Fetal Heart Rate A Mode: External Baseline Rate (A): 135 bpm Variability: Moderate Accelerations: 15 x 15 Decelerations: None Multiple birth?: No  Uterine Activity Mode: Toco Contraction Frequency (min): 3-5 Contraction Duration (sec): 70-100 Contraction Quality: Mild (pt says they feel tight) Resting Tone Palpated: Relaxed  Interpretation (Fetal Testing) Nonstress Test Interpretation: Reactive Overall Impression: Reassuring for gestational age Comments: tracing reviewed by Dr. Grace Bushy

## 2022-10-23 ENCOUNTER — Other Ambulatory Visit: Payer: Self-pay | Admitting: *Deleted

## 2022-10-23 DIAGNOSIS — O403XX0 Polyhydramnios, third trimester, not applicable or unspecified: Secondary | ICD-10-CM

## 2022-10-23 DIAGNOSIS — O09293 Supervision of pregnancy with other poor reproductive or obstetric history, third trimester: Secondary | ICD-10-CM

## 2022-10-26 ENCOUNTER — Encounter: Payer: Self-pay | Admitting: Obstetrics & Gynecology

## 2022-10-26 DIAGNOSIS — O403XX0 Polyhydramnios, third trimester, not applicable or unspecified: Secondary | ICD-10-CM | POA: Insufficient documentation

## 2022-10-27 ENCOUNTER — Ambulatory Visit: Payer: Medicaid Other | Attending: Maternal & Fetal Medicine

## 2022-10-27 ENCOUNTER — Encounter: Payer: Self-pay | Admitting: *Deleted

## 2022-10-27 ENCOUNTER — Ambulatory Visit: Payer: Medicaid Other | Admitting: *Deleted

## 2022-10-27 ENCOUNTER — Other Ambulatory Visit: Payer: Self-pay | Admitting: *Deleted

## 2022-10-27 VITALS — BP 129/85 | HR 83

## 2022-10-27 DIAGNOSIS — O0933 Supervision of pregnancy with insufficient antenatal care, third trimester: Secondary | ICD-10-CM

## 2022-10-27 DIAGNOSIS — Z8632 Personal history of gestational diabetes: Secondary | ICD-10-CM

## 2022-10-27 DIAGNOSIS — O403XX0 Polyhydramnios, third trimester, not applicable or unspecified: Secondary | ICD-10-CM

## 2022-10-27 DIAGNOSIS — O09293 Supervision of pregnancy with other poor reproductive or obstetric history, third trimester: Secondary | ICD-10-CM | POA: Diagnosis not present

## 2022-10-27 DIAGNOSIS — O35EXX0 Maternal care for other (suspected) fetal abnormality and damage, fetal genitourinary anomalies, not applicable or unspecified: Secondary | ICD-10-CM

## 2022-10-27 DIAGNOSIS — O283 Abnormal ultrasonic finding on antenatal screening of mother: Secondary | ICD-10-CM | POA: Diagnosis not present

## 2022-10-27 DIAGNOSIS — Z3A35 35 weeks gestation of pregnancy: Secondary | ICD-10-CM

## 2022-11-02 ENCOUNTER — Ambulatory Visit (INDEPENDENT_AMBULATORY_CARE_PROVIDER_SITE_OTHER): Payer: Medicaid Other | Admitting: Advanced Practice Midwife

## 2022-11-02 ENCOUNTER — Other Ambulatory Visit (HOSPITAL_COMMUNITY)
Admission: RE | Admit: 2022-11-02 | Discharge: 2022-11-02 | Disposition: A | Discharge status: Home / Self Care | Payer: Medicaid Other | Source: Ambulatory Visit | Attending: Advanced Practice Midwife | Admitting: Advanced Practice Midwife

## 2022-11-02 VITALS — BP 123/80 | HR 84 | Wt 160.0 lb

## 2022-11-02 DIAGNOSIS — Z3A36 36 weeks gestation of pregnancy: Secondary | ICD-10-CM

## 2022-11-02 DIAGNOSIS — Z3480 Encounter for supervision of other normal pregnancy, unspecified trimester: Secondary | ICD-10-CM

## 2022-11-02 DIAGNOSIS — O403XX0 Polyhydramnios, third trimester, not applicable or unspecified: Secondary | ICD-10-CM

## 2022-11-02 NOTE — Progress Notes (Signed)
[place] PREGNANCY VISIT Patient name: Meredith Griffin MRN [id number]  Date of birth: 29-Mar-2001 Chief Complaint:   Routine Prenatal Visit  History of Present Illness:   Meredith Griffin is a 21 y.o. G1P1011 female at [redacted]w[redacted]d with an Estimated Date of Delivery: 11/26/22 being seen today for ongoing management of a high-risk pregnancy complicated by polyhydramnios.    Today she reports a tiny bit of spotting when she wiped. Contractions: Not present. Vag. Bleeding: Scant.  Movement: Present. denies leaking of fluid.      09/14/2022   11:08 AM 06/24/2022   10:06 AM 06/24/2022   10:05 AM 12/31/2016    3:32 PM  Depression screen PHQ 2/9  Decreased Interest 0 0 0 2  Down, Depressed, Hopeless 0 0 0 2  PHQ - 2 Score 0 0 0 4  Altered sleeping 0 1 1 2   Tired, decreased energy 0 1 1 3   Change in appetite 0 0 0 0  Feeling bad or failure about yourself  0 0 0 3  Trouble concentrating 0 0 0 3  Moving slowly or fidgety/restless 0 0 0 3  Suicidal thoughts 0  0 0  PHQ-9 Score 0 2 2 18         09/14/2022   11:08 AM 06/24/2022   10:05 AM  GAD 7 : Generalized Anxiety Score  Nervous, Anxious, on Edge 0 0  Control/stop worrying 0 0  Worry too much - different things 0 0  Trouble relaxing 0 0  Restless 0 0  Easily annoyed or irritable 0 0  Afraid - awful might happen 0 0  Total GAD 7 Score 0 0     Review of Systems:   Pertinent items are noted in HPI Denies abnormal vaginal discharge w/ itching/odor/irritation, headaches, visual changes, shortness of breath, chest pain, abdominal pain, severe nausea/vomiting, or problems with urination or bowel movements unless otherwise stated above. Pertinent History Reviewed:  Reviewed past medical,surgical, social, obstetrical and family history.  Reviewed problem list, medications and allergies. Physical Assessment:   Vitals:   11/02/22 1503  BP: 123/80  Pulse: 84  Weight: 160 lb (72.6 kg)  Body mass index is 29.26 kg/m.           Physical  Examination:   General appearance: alert, well appearing, and in no distress  Mental status: alert, oriented to person, place, and time  Skin: warm & dry   Extremities: Edema: None    Cardiovascular: normal heart rate noted  Respiratory: normal respiratory effort, no distress  Abdomen: gravid, soft, non-tender  Pelvic: Cervical exam performed  Dilation: 1.5 Effacement (%): 50 Station: Ballotable  Fetal Status: Fetal Heart Rate (bpm): 142   Movement: Present    Fetal Surveillance Testing today: Doppler   Chaperone: Celene Squibb    No results found for this or any previous visit (from the past 24 hour(s)).  Assessment & Plan:  High-risk pregnancy: G3P1011 at [redacted]w[redacted]d with an Estimated Date of Delivery: 11/26/22      ICD-10-CM   1. [redacted] weeks gestation of pregnancy  Z3A.36 Cervicovaginal ancillary only( [place])    Culture, beta strep (group b only)    2. Supervision of other normal pregnancy, antepartum  Z34.80 Cervicovaginal ancillary only( [place])    Culture, beta strep (group b only)    3. Polyhydramnios affecting pregnancy in third trimester  O40.3XX0         Meds: No orders of the defined types were placed in this encounter.  Orders:  Orders Placed This Encounter  Procedures   Culture, beta strep (group b only)     Labs/procedures today: GBS, GC/CT, and SVE   Reviewed: Term labor symptoms and general obstetric precautions including but not limited to vaginal bleeding, contractions, leaking of fluid and fetal movement were reviewed in detail with the patient.  All questions were answered. Does have home bp cuff. Office bp cuff given: not applicable. Check bp weekly, let us know if consistently >140 and/or >90.  Follow-up: No follow-ups on file.   Future Appointments  Date Time Provider Department Center  11/05/2022  2:30 PM [facility] NURSE [facility]  11/05/2022  2:45 PM [facility] US7 [facility]  11/09/2022  2:50 PM Cheral Marker, CNM [facility]   11/16/2022  2:50 PM Jacklyn Shell, CNM [facility]  11/17/2022  2:15 PM [facility] NURSE [facility]  11/17/2022  2:30 PM [facility] US3 [facility]  11/23/2022  2:50 PM Cheral Marker, CNM [facility]  11/24/2022  2:15 PM [facility] NURSE [facility]  11/24/2022  2:30 PM [facility] US3 [facility]    Orders Placed This Encounter  Procedures   Culture, beta strep (group b only)   Jacklyn Shell , DNP, CNM [facility] 11/02/2022 3:29 PM

## 2022-11-04 LAB — CERVICOVAGINAL ANCILLARY ONLY
Chlamydia: NEGATIVE
Comment: NEGATIVE
Comment: NORMAL
Neisseria Gonorrhea: NEGATIVE

## 2022-11-05 ENCOUNTER — Ambulatory Visit: Payer: Medicaid Other | Attending: Maternal & Fetal Medicine

## 2022-11-05 ENCOUNTER — Ambulatory Visit: Payer: Medicaid Other | Admitting: *Deleted

## 2022-11-05 VITALS — BP 128/92 | HR 103

## 2022-11-05 DIAGNOSIS — Z8632 Personal history of gestational diabetes: Secondary | ICD-10-CM | POA: Diagnosis present

## 2022-11-05 DIAGNOSIS — O0933 Supervision of pregnancy with insufficient antenatal care, third trimester: Secondary | ICD-10-CM

## 2022-11-05 DIAGNOSIS — O403XX0 Polyhydramnios, third trimester, not applicable or unspecified: Secondary | ICD-10-CM | POA: Diagnosis present

## 2022-11-05 DIAGNOSIS — O09293 Supervision of pregnancy with other poor reproductive or obstetric history, third trimester: Secondary | ICD-10-CM | POA: Insufficient documentation

## 2022-11-05 DIAGNOSIS — Z3A37 37 weeks gestation of pregnancy: Secondary | ICD-10-CM

## 2022-11-05 DIAGNOSIS — O35AXX0 Maternal care for other (suspected) fetal abnormality and damage, fetal facial anomalies, not applicable or unspecified: Secondary | ICD-10-CM

## 2022-11-06 LAB — CULTURE, BETA STREP (GROUP B ONLY): Strep Gp B Culture: NEGATIVE

## 2022-11-09 ENCOUNTER — Encounter: Payer: Self-pay | Admitting: Women's Health

## 2022-11-09 ENCOUNTER — Ambulatory Visit (INDEPENDENT_AMBULATORY_CARE_PROVIDER_SITE_OTHER): Payer: Medicaid Other | Admitting: Women's Health

## 2022-11-09 VITALS — BP 131/93 | HR 87 | Wt 160.5 lb

## 2022-11-09 DIAGNOSIS — O1493 Unspecified pre-eclampsia, third trimester: Secondary | ICD-10-CM

## 2022-11-09 DIAGNOSIS — Z3A37 37 weeks gestation of pregnancy: Secondary | ICD-10-CM

## 2022-11-09 DIAGNOSIS — O0993 Supervision of high risk pregnancy, unspecified, third trimester: Secondary | ICD-10-CM

## 2022-11-09 DIAGNOSIS — Z3480 Encounter for supervision of other normal pregnancy, unspecified trimester: Secondary | ICD-10-CM

## 2022-11-09 DIAGNOSIS — O139 Gestational [pregnancy-induced] hypertension without significant proteinuria, unspecified trimester: Secondary | ICD-10-CM | POA: Insufficient documentation

## 2022-11-09 DIAGNOSIS — O1490 Unspecified pre-eclampsia, unspecified trimester: Secondary | ICD-10-CM | POA: Insufficient documentation

## 2022-11-09 DIAGNOSIS — Z8759 Personal history of other complications of pregnancy, childbirth and the puerperium: Secondary | ICD-10-CM | POA: Insufficient documentation

## 2022-11-09 LAB — POCT URINALYSIS DIPSTICK OB
Glucose, UA: NEGATIVE
Ketones, UA: NEGATIVE

## 2022-11-09 NOTE — Progress Notes (Signed)
[place] PREGNANCY VISIT Patient name: Meredith Griffin MRN [id number]  Date of birth: 2001/01/31 Chief Complaint:   Routine Prenatal Visit  History of Present Illness:   Meredith Griffin is a 21 y.o. G59P1011 female at [redacted]w[redacted]d with an Estimated Date of Delivery: 11/26/22 being seen today for ongoing management of a high-risk pregnancy complicated by mild polyhydramnios and new dx pre-e.    Today she reports occasional contractions. Denies ha, visual changes, ruq/epigastric pain, n/v.   Contractions: Irritability. Vag. Bleeding: None.  Movement: Present. denies leaking of fluid.      09/14/2022   11:08 AM 06/24/2022   10:06 AM 06/24/2022   10:05 AM 12/31/2016    3:32 PM  Depression screen PHQ 2/9  Decreased Interest 0 0 0 2  Down, Depressed, Hopeless 0 0 0 2  PHQ - 2 Score 0 0 0 4  Altered sleeping 0 1 1 2   Tired, decreased energy 0 1 1 3   Change in appetite 0 0 0 0  Feeling bad or failure about yourself  0 0 0 3  Trouble concentrating 0 0 0 3  Moving slowly or fidgety/restless 0 0 0 3  Suicidal thoughts 0  0 0  PHQ-9 Score 0 2 2 18         09/14/2022   11:08 AM 06/24/2022   10:05 AM  GAD 7 : Generalized Anxiety Score  Nervous, Anxious, on Edge 0 0  Control/stop worrying 0 0  Worry too much - different things 0 0  Trouble relaxing 0 0  Restless 0 0  Easily annoyed or irritable 0 0  Afraid - awful might happen 0 0  Total GAD 7 Score 0 0     Review of Systems:   Pertinent items are noted in HPI Denies abnormal vaginal discharge w/ itching/odor/irritation, headaches, visual changes, shortness of breath, chest pain, abdominal pain, severe nausea/vomiting, or problems with urination or bowel movements unless otherwise stated above. Pertinent History Reviewed:  Reviewed past medical,surgical, social, obstetrical and family history.  Reviewed problem list, medications and allergies. Physical Assessment:   Vitals:   11/09/22 1508 11/09/22 1513  BP: (!) 132/95 (!) 131/93   Pulse: 80 87  Weight: 160 lb 8 oz (72.8 kg)   Body mass index is 29.36 kg/m.           Physical Examination:   General appearance: alert, well appearing, and in no distress  Mental status: alert, oriented to person, place, and time  Skin: warm & dry   Extremities:      Cardiovascular: normal heart rate noted  Respiratory: normal respiratory effort, no distress  Abdomen: gravid, soft, non-tender  Pelvic: Cervical exam deferred         Fetal Status:     Movement: Present    Fetal Surveillance Testing today: NST: FHR baseline 135 bpm, Variability: moderate, Accelerations:present, Decelerations:  Absent= Cat 1/reactive Toco: q 2-31mins    Chaperone: N/A    Results for orders placed or performed in visit on 11/09/22 (from the past 24 hour(s))  POC Urinalysis Dipstick OB   Collection Time: 11/09/22  3:13 PM  Result Value Ref Range   Color, UA     Clarity, UA     Glucose, UA Negative Negative   Bilirubin, UA     Ketones, UA neg    Spec Grav, UA     Blood, UA moderate    pH, UA     POC,PROTEIN,UA Large (3+) Negative, Trace, Small (1+), Moderate (2+),  Large (3+), 4+   Urobilinogen, UA     Nitrite, UA     Leukocytes, UA Trace (A) Negative   Appearance     Odor      Assessment & Plan:  High-risk pregnancy: G3P1011 at [redacted]w[redacted]d with an Estimated Date of Delivery: 11/26/22   1) Pre-e, dx today, elevated bp @ 32wks, 37wks, and today, today has 3+ proteinuria. Asymptomatic. Reactive NST. L&D full, no nurses to support direct admit, scheduled for IOL tomorrow am.  IOL form faxed and orders placed Check bp's tonight, if >160/110 or pre-e s/s, go to [facility]  2) Mild polyhydramnios, AFI had been 34cm, down to 26.5cm w/ BPP 8/8 on 11/30 @ 37.0wk  3) Fetal unilateral cleft lip  Meds: No orders of the defined types were placed in this encounter.  Labs/procedures today: NST  Follow-up: Return for will schedule bp check after delivery; cancel remaining appts.   Future Appointments  Date  Time Provider Department Center  11/10/2022  7:00 AM [facility] SCHED ROOM [facility] None  11/12/2022  3:00 PM [facility] NURSE [facility]  11/12/2022  3:15 PM [facility] NST [facility]  11/16/2022  2:50 PM Jacklyn Shell, CNM [facility]  11/17/2022  2:15 PM [facility] NURSE [facility]  11/17/2022  2:30 PM [facility] US3 [facility]  11/23/2022  2:50 PM Cheral Marker, CNM [facility]  11/24/2022  2:15 PM [facility] NURSE [facility]  11/24/2022  2:30 PM [facility] US3 [facility]    Orders Placed This Encounter  Procedures   POC Urinalysis Dipstick OB   Cheral Marker CNM, [facility] 11/09/2022 4:27 PM

## 2022-11-09 NOTE — Patient Instructions (Signed)
Meredith Griffin, thank you for choosing our office today! We appreciate the opportunity to meet your healthcare needs. You may receive a short survey by mail, e-mail, or through [employer]. If you are happy with your care we would appreciate if you could take just a few minutes to complete the survey questions. We read all of your comments and take your feedback very seriously. Thank you again for choosing our office.  Center for [employer] Team at [facility]  [facility] at [facility] ([address] [place], [place] [address]) Entrance C, located off of [address] Free 24/7 valet parking   CLASSES: Go to [email or web address] to register for classes (childbirth, breastfeeding, waterbirth, infant CPR, daddy bootcamp, etc.)  Call the office [phone]) or go to [facility] if: You begin to have strong, frequent contractions Your water breaks.  Sometimes it is a big gush of fluid, sometimes it is just a trickle that keeps getting your panties wet or running down your legs You have vaginal bleeding.  It is normal to have a small amount of spotting if your cervix was checked.  You don't feel your baby moving like normal.  If you don't, get you something to eat and drink and lay down and focus on feeling your baby move.   If your baby is still not moving like normal, you should call the office or go to [facility].  Call the office [phone]) or go to [facility] hospital for these signs of pre-eclampsia: Severe headache that does not go away with Tylenol Visual changes- seeing spots, double, blurred vision Pain under your right breast or upper abdomen that does not go away with Tums or heartburn medicine Nausea and/or vomiting Severe swelling in your hands, feet, and face   [facility] Pediatricians/Family Doctors [facility]): [address] Colette Ribas, [phone]           [facility]: [address], [phone]                 [facility] Family Medicine [facility]): [address], [phone] (call to ask if accepting patients) [facility] Department: [address] [place], [phone]    [facility] Pediatricians/Family Doctors [facility]): [address], [phone] [facility]: [address] [place], [phone] [facility]: [address], [phone]  [facility] Doctors  [facility]): [phone] [facility]: [address] [phone]   [facility] Doctors [facility]: [address], [phone]  [facility] Doctors  [facility]: [phone], [phone]  Home Blood Pressure Monitoring for Patients   Your provider has recommended that you check your blood pressure (BP) at least once a week at home. If you do not have a blood pressure cuff at home, one will be provided for you. Contact your provider if you have not received your monitor within 1 week.   Helpful Tips for Accurate Home Blood Pressure Checks  Don't smoke, exercise, or drink caffeine 30 minutes before checking your BP Use the restroom before checking your BP (a full bladder can raise your pressure) Relax in a comfortable upright chair Feet on the ground Left arm resting comfortably on a flat surface at the level of your heart Legs uncrossed Back supported Sit quietly and don't talk Place the cuff on your bare arm Adjust snuggly, so that only two fingertips  can fit between your skin and the top of the cuff Check 2 readings separated by at least one minute Keep a log of your BP readings For a visual, please reference this diagram: http://ccnc.care/bpdiagram  Provider Name: [facility]     Phone: [phone]  Zone 1: ALL CLEAR  Continue to monitor your symptoms:  BP reading is less than 140 (top number) or less than 90 (bottom number)  No right  upper stomach pain No headaches or seeing spots No feeling nauseated or throwing up No swelling in face and hands  Zone 2: CAUTION Call your doctor's office for any of the following:  BP reading is greater than 140 (top number) or greater than 90 (bottom number)  Stomach pain under your ribs in the middle or right side Headaches or seeing spots Feeling nauseated or throwing up Swelling in face and hands  Zone 3: EMERGENCY  Seek immediate medical care if you have any of the following:  BP reading is greater than160 (top number) or greater than 110 (bottom number) Severe headaches not improving with Tylenol Serious difficulty catching your breath Any worsening symptoms from Zone 2   Braxton Hicks Contractions Contractions of the uterus can occur throughout pregnancy, but they are not always a sign that you are in labor. You may have practice contractions called Braxton Hicks contractions. These false labor contractions are sometimes confused with true labor. What are [place] contractions? Braxton Hicks contractions are tightening movements that occur in the muscles of the uterus before labor. Unlike true labor contractions, these contractions do not result in opening (dilation) and thinning of the cervix. Toward the end of pregnancy (32-34 weeks), Braxton Hicks contractions can happen more often and may become stronger. These contractions are sometimes difficult to tell apart from true labor because they can be very uncomfortable. You should not feel embarrassed if you go to the hospital with false labor. Sometimes, the only way to tell if you are in true labor is for your health care provider to look for changes in the cervix. The health care provider will do a physical exam and may monitor your contractions. If you are not in true labor, the exam should show that your cervix is not dilating and your water has not broken. If there are no other health problems associated with your  pregnancy, it is completely safe for you to be sent home with false labor. You may continue to have Braxton Hicks contractions until you go into true labor. How to tell the difference between true labor and false labor True labor Contractions last 30-70 seconds. Contractions become very regular. Discomfort is usually felt in the top of the uterus, and it spreads to the lower abdomen and low back. Contractions do not go away with walking. Contractions usually become more intense and increase in frequency. The cervix dilates and gets thinner. False labor Contractions are usually shorter and not as strong as true labor contractions. Contractions are usually irregular. Contractions are often felt in the front of the lower abdomen and in the groin. Contractions may go away when you walk around or change positions while lying down. Contractions get weaker and are shorter-lasting as time goes on. The cervix usually does not dilate or become thin. Follow these instructions at home:  Take over-the-counter and prescription medicines only as told by your health care provider. Keep up with your usual exercises and follow other instructions from your health care provider. Eat and drink lightly if you think  you are going into labor. If Braxton Hicks contractions are making you uncomfortable: Change your position from lying down or resting to walking, or change from walking to resting. Sit and rest in a tub of warm water. Drink enough fluid to keep your urine pale yellow. Dehydration may cause these contractions. Do slow and deep breathing several times an hour. Keep all follow-up prenatal visits as told by your health care provider. This is important. Contact a health care provider if: You have a fever. You have continuous pain in your abdomen. Get help right away if: Your contractions become stronger, more regular, and closer together. You have fluid leaking or gushing from your vagina. You pass  blood-tinged mucus (bloody show). You have bleeding from your vagina. You have low back pain that you never had before. You feel your baby's head pushing down and causing pelvic pressure. Your baby is not moving inside you as much as it used to. Summary Contractions that occur before labor are called Braxton Hicks contractions, false labor, or practice contractions. Braxton Hicks contractions are usually shorter, weaker, farther apart, and less regular than true labor contractions. True labor contractions usually become progressively stronger and regular, and they become more frequent. Manage discomfort from [facility] contractions by changing position, resting in a warm bath, drinking plenty of water, or practicing deep breathing. This information is not intended to replace advice given to you by your health care provider. Make sure you discuss any questions you have with your health care provider. Document Revised: 11/05/2017 Document Reviewed: 04/08/2017 Elsevier Patient Education  [place].

## 2022-11-10 ENCOUNTER — Inpatient Hospital Stay (HOSPITAL_COMMUNITY): Payer: Medicaid Other | Admitting: Anesthesiology

## 2022-11-10 ENCOUNTER — Inpatient Hospital Stay (HOSPITAL_COMMUNITY): Payer: Medicaid Other

## 2022-11-10 ENCOUNTER — Encounter (HOSPITAL_COMMUNITY): Payer: Self-pay | Admitting: Obstetrics & Gynecology

## 2022-11-10 ENCOUNTER — Inpatient Hospital Stay (HOSPITAL_COMMUNITY)
Admission: AD | Admit: 2022-11-10 | Discharge: 2022-11-12 | DRG: 807 | Disposition: A | Discharge status: Home / Self Care | Payer: Medicaid Other | Attending: Family Medicine | Admitting: Family Medicine

## 2022-11-10 DIAGNOSIS — Z8759 Personal history of other complications of pregnancy, childbirth and the puerperium: Secondary | ICD-10-CM | POA: Diagnosis present

## 2022-11-10 DIAGNOSIS — O99334 Smoking (tobacco) complicating childbirth: Secondary | ICD-10-CM | POA: Diagnosis present

## 2022-11-10 DIAGNOSIS — O139 Gestational [pregnancy-induced] hypertension without significant proteinuria, unspecified trimester: Secondary | ICD-10-CM | POA: Diagnosis present

## 2022-11-10 DIAGNOSIS — O35AXX0 Maternal care for other (suspected) fetal abnormality and damage, fetal facial anomalies, not applicable or unspecified: Secondary | ICD-10-CM | POA: Diagnosis present

## 2022-11-10 DIAGNOSIS — O403XX0 Polyhydramnios, third trimester, not applicable or unspecified: Secondary | ICD-10-CM | POA: Diagnosis present

## 2022-11-10 DIAGNOSIS — Z30430 Encounter for insertion of intrauterine contraceptive device: Secondary | ICD-10-CM | POA: Diagnosis not present

## 2022-11-10 DIAGNOSIS — Z3A37 37 weeks gestation of pregnancy: Secondary | ICD-10-CM

## 2022-11-10 DIAGNOSIS — O403XX1 Polyhydramnios, third trimester, fetus 1: Secondary | ICD-10-CM | POA: Diagnosis not present

## 2022-11-10 DIAGNOSIS — Z3480 Encounter for supervision of other normal pregnancy, unspecified trimester: Secondary | ICD-10-CM

## 2022-11-10 DIAGNOSIS — Z8632 Personal history of gestational diabetes: Secondary | ICD-10-CM | POA: Diagnosis not present

## 2022-11-10 DIAGNOSIS — O35AXX1 Maternal care for other (suspected) fetal abnormality and damage, fetal facial anomalies, fetus 1: Secondary | ICD-10-CM | POA: Diagnosis not present

## 2022-11-10 DIAGNOSIS — O1494 Unspecified pre-eclampsia, complicating childbirth: Secondary | ICD-10-CM | POA: Diagnosis present

## 2022-11-10 DIAGNOSIS — O134 Gestational [pregnancy-induced] hypertension without significant proteinuria, complicating childbirth: Principal | ICD-10-CM | POA: Diagnosis present

## 2022-11-10 DIAGNOSIS — F17290 Nicotine dependence, other tobacco product, uncomplicated: Secondary | ICD-10-CM | POA: Diagnosis present

## 2022-11-10 DIAGNOSIS — Z8279 Family history of other congenital malformations, deformations and chromosomal abnormalities: Secondary | ICD-10-CM | POA: Diagnosis present

## 2022-11-10 DIAGNOSIS — Z975 Presence of (intrauterine) contraceptive device: Secondary | ICD-10-CM

## 2022-11-10 DIAGNOSIS — O1493 Unspecified pre-eclampsia, third trimester: Secondary | ICD-10-CM

## 2022-11-10 DIAGNOSIS — O1404 Mild to moderate pre-eclampsia, complicating childbirth: Secondary | ICD-10-CM | POA: Diagnosis not present

## 2022-11-10 DIAGNOSIS — O0993 Supervision of high risk pregnancy, unspecified, third trimester: Secondary | ICD-10-CM

## 2022-11-10 HISTORY — PX: IUD INSERTION: OBO1003

## 2022-11-10 LAB — COMPREHENSIVE METABOLIC PANEL
ALT: 17 U/L (ref 0–44)
AST: 18 U/L (ref 15–41)
Albumin: 2.7 g/dL — ABNORMAL LOW (ref 3.5–5.0)
Alkaline Phosphatase: 152 U/L — ABNORMAL HIGH (ref 38–126)
Anion gap: 9 (ref 5–15)
BUN: 8 mg/dL (ref 6–20)
CO2: 18 mmol/L — ABNORMAL LOW (ref 22–32)
Calcium: 9.2 mg/dL (ref 8.9–10.3)
Chloride: 108 mmol/L (ref 98–111)
Creatinine, Ser: 0.71 mg/dL (ref 0.44–1.00)
GFR, Estimated: >60 mL/min (ref >60)
Glucose, Bld: 70 mg/dL (ref 70–99)
Potassium: 3.9 mmol/L (ref 3.5–5.1)
Sodium: 135 mmol/L (ref 135–145)
Total Bilirubin: 0.4 mg/dL (ref 0.3–1.2)
Total Protein: 6.1 g/dL — ABNORMAL LOW (ref 6.5–8.1)

## 2022-11-10 LAB — TYPE AND SCREEN
ABO/RH(D): O POS
Antibody Screen: NEGATIVE

## 2022-11-10 LAB — CBC
HCT: 43.1 % (ref 36.0–46.0)
HCT: 40.1 % (ref 36.0–46.0)
Hemoglobin: 14.7 g/dL (ref 12.0–15.0)
Hemoglobin: 13.5 g/dL (ref 12.0–15.0)
MCH: 29.1 pg (ref 26.0–34.0)
MCH: 28.8 pg (ref 26.0–34.0)
MCHC: 34.1 g/dL (ref 30.0–36.0)
MCHC: 33.7 g/dL (ref 30.0–36.0)
MCV: 85.2 fL (ref 80.0–100.0)
MCV: 85.7 fL (ref 80.0–100.0)
Platelets: 223 10*3/uL (ref 150–400)
Platelets: 218 10*3/uL (ref 150–400)
RBC: 5.06 MIL/uL (ref 3.87–5.11)
RBC: 4.68 MIL/uL (ref 3.87–5.11)
RDW: 13.2 % (ref 11.5–15.5)
RDW: 13.2 % (ref 11.5–15.5)
WBC: 9.9 10*3/uL (ref 4.0–10.5)
WBC: 12.2 10*3/uL — ABNORMAL HIGH (ref 4.0–10.5)
nRBC: 0 % (ref 0.0–0.2)
nRBC: 0 % (ref 0.0–0.2)

## 2022-11-10 LAB — PROTEIN / CREATININE RATIO, URINE
Creatinine, Urine: 138 mg/dL
Protein Creatinine Ratio: 0.19 mg/mg{Cre} — ABNORMAL HIGH (ref 0.00–0.15)
Total Protein, Urine: 26 mg/dL

## 2022-11-10 LAB — RPR: RPR Ser Ql: NONREACTIVE

## 2022-11-10 MED ORDER — PHENYLEPHRINE 80 MCG/ML (10ML) SYRINGE FOR IV PUSH (FOR BLOOD PRESSURE SUPPORT): 80.0000 ug | PREFILLED_SYRINGE | INTRAVENOUS | Status: DC | PRN

## 2022-11-10 MED ORDER — PRENATAL MULTIVITAMIN CH
1.0000 | ORAL_TABLET | Freq: Every day | ORAL | Status: DC
  Administered 2022-11-11 – 2022-11-12 (×2): 1 via ORAL
  Filled 2022-11-10: qty 1

## 2022-11-10 MED ORDER — HYDRALAZINE HCL 20 MG/ML IJ SOLN: 10.0000 mg | INTRAMUSCULAR | Status: DC | PRN

## 2022-11-10 MED ORDER — ONDANSETRON HCL 4 MG PO TABS: 4.0000 mg | ORAL_TABLET | ORAL | Status: DC | PRN

## 2022-11-10 MED ORDER — EPHEDRINE 5 MG/ML INJ: 10.0000 mg | INTRAVENOUS | Status: DC | PRN

## 2022-11-10 MED ORDER — SODIUM CHLORIDE 0.9% FLUSH: 3.0000 mL | INTRAVENOUS | Status: DC | PRN

## 2022-11-10 MED ORDER — LIDOCAINE HCL (PF) 1 % IJ SOLN: 30.0000 mL | INTRAMUSCULAR | Status: DC | PRN

## 2022-11-10 MED ORDER — DIPHENHYDRAMINE HCL 50 MG/ML IJ SOLN: 12.5000 mg | INTRAMUSCULAR | Status: DC | PRN

## 2022-11-10 MED ORDER — ZOLPIDEM TARTRATE 5 MG PO TABS: 5.0000 mg | ORAL_TABLET | Freq: Every evening | ORAL | Status: DC | PRN

## 2022-11-10 MED ORDER — SIMETHICONE 80 MG PO CHEW: 80.0000 mg | CHEWABLE_TABLET | ORAL | Status: DC | PRN

## 2022-11-10 MED ORDER — OXYCODONE-ACETAMINOPHEN 5-325 MG PO TABS: 2.0000 | ORAL_TABLET | ORAL | Status: DC | PRN

## 2022-11-10 MED ORDER — OXYCODONE-ACETAMINOPHEN 5-325 MG PO TABS: 1.0000 | ORAL_TABLET | ORAL | Status: DC | PRN

## 2022-11-10 MED ORDER — MISOPROSTOL 25 MCG QUARTER TABLET
25.0000 ug | ORAL_TABLET | Freq: Once | ORAL | Status: AC
  Administered 2022-11-10: 25 ug via VAGINAL
  Filled 2022-11-10 (×2): qty 1

## 2022-11-10 MED ORDER — SODIUM CHLORIDE 0.9 % IV SOLN: 250.0000 mL | INTRAVENOUS | Status: DC | PRN

## 2022-11-10 MED ORDER — DIBUCAINE (PERIANAL) 1 % EX OINT: 1.0000 | TOPICAL_OINTMENT | CUTANEOUS | Status: DC | PRN

## 2022-11-10 MED ORDER — FENTANYL-BUPIVACAINE-NACL 0.5-0.125-0.9 MG/250ML-% EP SOLN
12.0000 mL/h | EPIDURAL | Status: DC | PRN
  Administered 2022-11-10: 12 mL/h via EPIDURAL
  Filled 2022-11-10: qty 250

## 2022-11-10 MED ORDER — OXYTOCIN-SODIUM CHLORIDE 30-0.9 UT/500ML-% IV SOLN: 2.5000 [IU]/h | INTRAVENOUS | Status: DC

## 2022-11-10 MED ORDER — LABETALOL HCL 5 MG/ML IV SOLN: 80.0000 mg | INTRAVENOUS | Status: DC | PRN

## 2022-11-10 MED ORDER — TERBUTALINE SULFATE 1 MG/ML IJ SOLN: 0.2500 mg | Freq: Once | INTRAMUSCULAR | Status: DC | PRN

## 2022-11-10 MED ORDER — ACETAMINOPHEN 325 MG PO TABS: 650.0000 mg | ORAL_TABLET | ORAL | Status: DC | PRN

## 2022-11-10 MED ORDER — DIPHENHYDRAMINE HCL 25 MG PO CAPS
25.0000 mg | ORAL_CAPSULE | Freq: Four times a day (QID) | ORAL | Status: DC | PRN
  Administered 2022-11-10: 25 mg via ORAL
  Filled 2022-11-10: qty 1

## 2022-11-10 MED ORDER — ONDANSETRON HCL 4 MG/2ML IJ SOLN: 4.0000 mg | Freq: Four times a day (QID) | INTRAMUSCULAR | Status: DC | PRN

## 2022-11-10 MED ORDER — LABETALOL HCL 5 MG/ML IV SOLN
20.0000 mg | INTRAVENOUS | Status: DC | PRN
  Administered 2022-11-10: 20 mg via INTRAVENOUS
  Filled 2022-11-10: qty 4

## 2022-11-10 MED ORDER — PARAGARD INTRAUTERINE COPPER IU IUD
1.0000 | INTRAUTERINE_SYSTEM | Freq: Once | INTRAUTERINE | Status: AC
  Administered 2022-11-10: 1 via INTRAUTERINE
  Filled 2022-11-10: qty 1

## 2022-11-10 MED ORDER — LIDOCAINE HCL (PF) 1 % IJ SOLN
INTRAMUSCULAR | Status: DC | PRN
  Administered 2022-11-10: 2 mL via EPIDURAL
  Administered 2022-11-10: 3 mL via EPIDURAL
  Administered 2022-11-10: 5 mL via EPIDURAL

## 2022-11-10 MED ORDER — FUROSEMIDE 20 MG PO TABS
20.0000 mg | ORAL_TABLET | Freq: Two times a day (BID) | ORAL | Status: DC
  Administered 2022-11-11 – 2022-11-12 (×3): 20 mg via ORAL
  Filled 2022-11-10 (×4): qty 1

## 2022-11-10 MED ORDER — OXYTOCIN BOLUS FROM INFUSION
333.0000 mL | Freq: Once | INTRAVENOUS | Status: AC
  Administered 2022-11-10: 333 mL via INTRAVENOUS

## 2022-11-10 MED ORDER — TETANUS-DIPHTH-ACELL PERTUSSIS 5-2.5-18.5 LF-MCG/0.5 IM SUSY: 0.5000 mL | PREFILLED_SYRINGE | Freq: Once | INTRAMUSCULAR | Status: DC

## 2022-11-10 MED ORDER — LACTATED RINGERS IV SOLN: 500.0000 mL | Freq: Once | INTRAVENOUS | Status: DC

## 2022-11-10 MED ORDER — OXYTOCIN-SODIUM CHLORIDE 30-0.9 UT/500ML-% IV SOLN
1.0000 m[IU]/min | INTRAVENOUS | Status: DC
  Administered 2022-11-10: 2 m[IU]/min via INTRAVENOUS
  Filled 2022-11-10: qty 500

## 2022-11-10 MED ORDER — SODIUM CHLORIDE 0.9% FLUSH: 3.0000 mL | Freq: Two times a day (BID) | INTRAVENOUS | Status: DC

## 2022-11-10 MED ORDER — HYDROXYZINE HCL 50 MG PO TABS: 50.0000 mg | ORAL_TABLET | Freq: Four times a day (QID) | ORAL | Status: DC | PRN

## 2022-11-10 MED ORDER — MISOPROSTOL 50MCG HALF TABLET
50.0000 ug | ORAL_TABLET | Freq: Once | ORAL | Status: AC
  Administered 2022-11-10: 50 ug via ORAL
  Filled 2022-11-10: qty 1

## 2022-11-10 MED ORDER — FLEET ENEMA 7-19 GM/118ML RE ENEM: 1.0000 | ENEMA | RECTAL | Status: DC | PRN

## 2022-11-10 MED ORDER — BENZOCAINE-MENTHOL 20-0.5 % EX AERO: 1.0000 | INHALATION_SPRAY | CUTANEOUS | Status: DC | PRN

## 2022-11-10 MED ORDER — FENTANYL CITRATE (PF) 100 MCG/2ML IJ SOLN: 100.0000 ug | INTRAMUSCULAR | Status: DC | PRN

## 2022-11-10 MED ORDER — ONDANSETRON HCL 4 MG/2ML IJ SOLN: 4.0000 mg | INTRAMUSCULAR | Status: DC | PRN

## 2022-11-10 MED ORDER — WITCH HAZEL-GLYCERIN EX PADS: 1.0000 | MEDICATED_PAD | CUTANEOUS | Status: DC | PRN

## 2022-11-10 MED ORDER — LACTATED RINGERS IV SOLN: INTRAVENOUS | Status: DC

## 2022-11-10 MED ORDER — SENNOSIDES-DOCUSATE SODIUM 8.6-50 MG PO TABS
2.0000 | ORAL_TABLET | ORAL | Status: DC
  Administered 2022-11-11 – 2022-11-12 (×2): 2 via ORAL
  Filled 2022-11-10 (×2): qty 2

## 2022-11-10 MED ORDER — COCONUT OIL OIL: 1.0000 | TOPICAL_OIL | Status: DC | PRN

## 2022-11-10 MED ORDER — LACTATED RINGERS IV SOLN: 500.0000 mL | INTRAVENOUS | Status: DC | PRN

## 2022-11-10 MED ORDER — IBUPROFEN 600 MG PO TABS
600.0000 mg | ORAL_TABLET | Freq: Four times a day (QID) | ORAL | Status: DC
  Administered 2022-11-10 – 2022-11-12 (×6): 600 mg via ORAL
  Filled 2022-11-10 (×6): qty 1

## 2022-11-10 MED ORDER — LABETALOL HCL 5 MG/ML IV SOLN
40.0000 mg | INTRAVENOUS | Status: DC | PRN
  Administered 2022-11-10: 40 mg via INTRAVENOUS
  Filled 2022-11-10: qty 8

## 2022-11-10 MED ORDER — NIFEDIPINE ER OSMOTIC RELEASE 30 MG PO TB24
30.0000 mg | ORAL_TABLET | Freq: Every day | ORAL | Status: DC
  Administered 2022-11-10 – 2022-11-12 (×3): 30 mg via ORAL
  Filled 2022-11-10 (×3): qty 1

## 2022-11-10 MED ORDER — SOD CITRATE-CITRIC ACID 500-334 MG/5ML PO SOLN
30.0000 mL | ORAL | Status: DC | PRN
  Administered 2022-11-10: 30 mL via ORAL
  Filled 2022-11-10: qty 30

## 2022-11-10 NOTE — Lactation Note (Signed)
This note was copied from a baby's chart. 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  Mother felt a good tug; no pain noted.  Reassured family that lactation services will be available on the M/B unit.  Support person at bedside.   Maternal Data    Feeding    LATCH Score Latch: Grasps breast easily, tongue down, lips flanged, rhythmical sucking.  Audible Swallowing: None  Type of Nipple: Everted at rest and after stimulation  Comfort (Breast/Nipple): Soft / non-tender  Hold (Positioning): Assistance needed to correctly position infant at breast and maintain latch.  LATCH Score: 7   Lactation Tools Discussed/Used    Interventions Interventions: Assisted with latch;Skin to skin  Discharge    Consult Status Consult Status: Follow-up from L&D    Norely Schlick R Kayton Ripp 11/10/2022, 6:59 PM

## 2022-11-10 NOTE — Anesthesia Procedure Notes (Signed)
Epidural Patient location during procedure: OB Start time: 11/10/2022 1:43 PM End time: 11/10/2022 1:50 PM  Staffing Anesthesiologist: Marcene Duos, MD Performed: anesthesiologist   Preanesthetic Checklist Completed: patient identified, IV checked, site marked, risks and benefits discussed, surgical consent, monitors and equipment checked, pre-op evaluation and timeout performed  Epidural Patient position: sitting Prep: DuraPrep and site prepped and draped Patient monitoring: continuous pulse ox and blood pressure Approach: midline Location: L4-L5 Injection technique: LOR air  Needle:  Needle type: Tuohy  Needle gauge: 17 G Needle length: 9 cm and 9 Needle insertion depth: 6 cm Catheter type: closed end flexible Catheter size: 19 Gauge Catheter at skin depth: 12 (11->12 when sat upright) cm Test dose: negative  Assessment Events: blood not aspirated, no cerebrospinal fluid, injection not painful, no injection resistance, no paresthesia and negative IV test

## 2022-11-10 NOTE — Procedures (Signed)
  Post-Placental IUD Insertion Procedure Note  Patient identified, informed consent signed prior to delivery, signed copy in chart, time out was performed.    Vaginal, labial and perineal areas thoroughly inspected for lacerations. B/l periurethral lacerations noted, b oth hemostatic, not repaired.   Paragard IUD grasped between sterile gloved fingers. Sterile lubrication applied to sterile gloved hand for ease of insertion. Fundus identified through abdominal wall using non-insertion hand. IUD inserted to fundus with bimanual technique. IUD carefully released at the fundus and insertion hand gently removed from vagina.    Strings trimmed to the level of the introitus. Patient tolerated procedure well.  Lot # [id number] Expiration Date   06/10/28   Patient given post procedure instructions and IUD care card with expiration date.  Patient is asked to keep IUD strings tucked in her vagina until her postpartum follow up visit in 4-6 weeks. Patient advised to abstain from sexual intercourse and pulling on strings before her follow-up visit. Patient verbalized an understanding of the plan of care and agrees.    Myrtie Hawk, DO FMOB Fellow, Faculty practice [facility], Center for [facility] 11/10/22  6:39 PM

## 2022-11-10 NOTE — Progress Notes (Addendum)
Labor Progress Note ADY HEIMANN is a 21 y.o. G3P1011 at [redacted]w[redacted]d presented for IOL d/t pre-E. S: Patient is resting comfortably with epidural.  O:  BP (!) 143/94   Pulse 95   Temp 98.9 F (37.2 C) (Oral)   Resp 16   Ht 5\' 1"  (1.549 m)   Wt 72.8 kg   SpO2 100%   BMI 30.33 kg/m   CVE: Dilation: 5 Effacement (%): 60 Station: -3 Presentation: Vertex Exam by:: J.Cox, RN  A&P: 21 y.o. 30 [redacted]w[redacted]d here for IOL for pre-E. #Labor: Latent, progressing. S/p dual cytotec. On pitocin now. Consider AROM when able. #Pain: Epidural #FWB: Cat 1 #gHTN: Protein/Cr ratio, AST/ALT, and plts WNL. BP elevated to 130-140s/80-100s, though no severe ranges identified. Suspect gHTN over pre-eclampsia given these normal lab findings.  [redacted]w[redacted]d, MD Center for Janeal Holmes, [facility] 2:23 PM

## 2022-11-10 NOTE — Anesthesia Preprocedure Evaluation (Addendum)
Anesthesia Evaluation  Patient identified by MRN, date of birth, ID band Patient awake    Reviewed: Allergy & Precautions, Patient's Chart, lab work & pertinent test results  Airway Mallampati: II  TM Distance: >3 FB     Dental  (+) Dental Advisory Given   Pulmonary Current Smoker   breath sounds clear to auscultation       Cardiovascular hypertension,  Rhythm:Regular Rate:Normal     Neuro/Psych negative neurological ROS     GI/Hepatic negative GI ROS, Neg liver ROS,,,  Endo/Other  negative endocrine ROS    Renal/GU negative Renal ROS     Musculoskeletal   Abdominal   Peds  Hematology negative hematology ROS (+)   Anesthesia Other Findings   Reproductive/Obstetrics (+) Pregnancy                             Anesthesia Physical Anesthesia Plan  ASA: 2  Anesthesia Plan: Epidural   Post-op Pain Management:    Induction:   PONV Risk Score and Plan: 1 and Treatment may vary due to age or medical condition  Airway Management Planned: Natural Airway  Additional Equipment:   Intra-op Plan:   Post-operative Plan:   Informed Consent: I have reviewed the patients History and Physical, chart, labs and discussed the procedure including the risks, benefits and alternatives for the proposed anesthesia with the patient or authorized representative who has indicated his/her understanding and acceptance.       Plan Discussed with:   Anesthesia Plan Comments:        Anesthesia Quick Evaluation

## 2022-11-10 NOTE — H&P (Addendum)
OBSTETRIC ADMISSION HISTORY AND PHYSICAL  Meredith Griffin is a 21 y.o. female G7P1011 with IUP at [redacted]w[redacted]d by [place] presenting for IOL d/t pre-e. She reports +FMs, No LOF, no VB, no blurry vision, headaches or peripheral edema, and RUQ pain.  She plans on breast feeding. She desires copper IUD for birth control. She [facility]   Dating: By [place] --->  Estimated Date of Delivery: 11/26/22  Sono:    @[redacted]w[redacted]d , CWD, normal anatomy other than L cleft lip, cephalic presentation,  [id number], 70% EFW  Prenatal History/Complications:  -Hx GDM, nl this pregnancy -New dx pre-e in office 12/4 with elevated BP and 3+ proteinuria -Fetal L cleft lip -Polyhydramnios this pregnancy 34cm>26.5cm  Past Medical History: Past Medical History:  Diagnosis Date   Gestational diabetes     Past Surgical History: Past Surgical History:  Procedure Laterality Date   MOUTH SURGERY     teeth removed   WISDOM TOOTH EXTRACTION      Obstetrical History: OB History     Gravida  3   Para  1   Term  1   Preterm  0   AB  1   Living  1      SAB  1   IAB  0   Ectopic  0   Multiple  0   Live Births  1           Social History Social History   Socioeconomic History   Marital status: Single    Spouse name: Not on file   Number of children: Not on file   Years of education: Not on file   Highest education level: Not on file  Occupational History   Not on file  Tobacco Use   Smoking status: Some Days    Types: E-cigarettes   Smokeless tobacco: Never  Vaping Use   Vaping Use: Former   Substances: Nicotine  Substance and Sexual Activity   Alcohol use: Not Currently    Alcohol/week: 1.0 standard drink of alcohol    Types: 1 Cans of beer per week   Drug use: No   Sexual activity: Yes  Other Topics Concern   Not on file  Social History Narrative   Not on file   Social Determinants of Health   Financial Resource Strain: Low Risk  (09/14/2022)   Overall  Financial Resource Strain (CARDIA)    Difficulty of Paying Living Expenses: Not hard at all  Food Insecurity: No Food Insecurity (11/10/2022)   Hunger Vital Sign    Worried About Running Out of Food in the Last Year: Never true    Ran Out of Food in the Last Year: Never true  Transportation Needs: No Transportation Needs (11/10/2022)   PRAPARE - Hydrologist (Medical): No    Lack of Transportation (Non-Medical): No  Physical Activity: Sufficiently Active (09/14/2022)   Exercise Vital Sign    Days of Exercise per Week: 5 days    Minutes of Exercise per Session: 50 min  Stress: No Stress Concern Present (09/14/2022)   [place]    Feeling of Stress : Not at all  Social Connections: Moderately Isolated (09/14/2022)   Social Connection and Isolation Panel [NHANES]    Frequency of Communication with Friends and Family: Once a week    Frequency of Social Gatherings with Friends and Family: Never    Attends Religious Services: 1 to 4  times per year    Active Member of Clubs or Organizations: No    Attends Banker Meetings: Never    Marital Status: Living with partner    Family History: Family History  Problem Relation Age of Onset   Diabetes Father    Hypertension Paternal Grandmother    Asthma Neg Hx    Cancer Neg Hx    Heart disease Neg Hx     Allergies: Allergies  Allergen Reactions   Amoxil [Amoxicillin] Rash    Medications Prior to Admission  Medication Sig Dispense Refill Last Dose   Prenatal Vit-Fe Fumarate-FA (PRENATAL VITAMIN PO) Take by mouth.        Review of Systems   All systems reviewed and negative except as stated in HPI  Blood pressure (!) 133/104, pulse 94, temperature 98.9 F (37.2 C), temperature source Oral, resp. rate 16, height 5\' 1"  (1.549 m), weight 72.8 kg. General appearance: alert, cooperative, and no distress Lungs: no increased WOB on  RA Heart: regular rate  Abdomen: gravid abdomen Extremities: no sign of DVT Presentation: cephalic Fetal monitoringBaseline: 125 bpm, Variability: Good {> 6 bpm), Accelerations: Reactive, and Decelerations: Absent Uterine activity: irregular Dilation: 4 Effacement (%): 50 Station: Ballotable Exam by:: [email or web address] RN   Prenatal labs: ABO, Rh: --/--/O POS (12/05 0730) Antibody: NEG (12/05 0730) Rubella: 1.98 (07/19 1121) RPR: Non Reactive (10/09 0902)  HBsAg: Negative (07/19 1121)  HIV: Non Reactive (10/09 0902)  GBS: Negative/-- (11/27 1530)  1 hr Glucola: normal Genetic screening:  normal Anatomy 08-06-1974: L cleft lip, otherwise normal  Prenatal Transfer Tool  Maternal Diabetes: No Genetic Screening: Normal Maternal Ultrasounds/Referrals: Other:left cleft lip Fetal Ultrasounds or other Referrals:  Referred to Materal Fetal Medicine for polyhydramnios 34cm>26.5cm, previous GDM Maternal Substance Abuse:  No Significant Maternal Medications:  None Significant Maternal Lab Results: Group B Strep negative  Results for orders placed or performed during the hospital encounter of 11/10/22 (from the past 24 hour(s))  Type and screen   Collection Time: 11/10/22  7:30 AM  Result Value Ref Range   ABO/RH(D) O POS    Antibody Screen NEG    Sample Expiration      11/13/2022,2359 Performed at [facility], [address] [place], [place]   CBC   Collection Time: 11/10/22  7:36 AM  Result Value Ref Range   WBC 9.9 4.0 - 10.5 K/uL   RBC 5.06 3.87 - 5.11 MIL/uL   Hemoglobin 14.7 12.0 - 15.0 g/dL   HCT 14/05/23 81.8 - 56.3 %   MCV 85.2 80.0 - 100.0 fL   MCH 29.1 26.0 - 34.0 pg   MCHC 34.1 30.0 - 36.0 g/dL   RDW 14.9 70.2 - 63.7 %   Platelets 223 150 - 400 K/uL   nRBC 0.0 0.0 - 0.2 %  Comprehensive metabolic panel   Collection Time: 11/10/22  7:36 AM  Result Value Ref Range   Sodium 135 135 - 145 mmol/L   Potassium 3.9 3.5 - 5.1 mmol/L   Chloride 108 98 - 111  mmol/L   CO2 18 (L) 22 - 32 mmol/L   Glucose, Bld 70 70 - 99 mg/dL   BUN 8 6 - 20 mg/dL   Creatinine, Ser 14/05/23 0.44 - 1.00 mg/dL   Calcium 9.2 8.9 - 8.50 mg/dL   Total Protein 6.1 (L) 6.5 - 8.1 g/dL   Albumin 2.7 (L) 3.5 - 5.0 g/dL   AST 18 15 - 41 U/L  ALT 17 0 - 44 U/L   Alkaline Phosphatase 152 (H) 38 - 126 U/L   Total Bilirubin 0.4 0.3 - 1.2 mg/dL   GFR, Estimated >60 >60 mL/min   Anion gap 9 5 - 15  Results for orders placed or performed in visit on 11/09/22 (from the past 24 hour(s))  POC Urinalysis Dipstick OB   Collection Time: 11/09/22  3:13 PM  Result Value Ref Range   Color, UA     Clarity, UA     Glucose, UA Negative Negative   Bilirubin, UA     Ketones, UA neg    Spec Grav, UA     Blood, UA moderate    pH, UA     POC,PROTEIN,UA Large (3+) Negative, Trace, Small (1+), Moderate (2+), Large (3+), 4+   Urobilinogen, UA     Nitrite, UA     Leukocytes, UA Trace (A) Negative   Appearance     Odor      Patient Active Problem List   Diagnosis Date Noted   Preeclampsia 11/09/2022   Polyhydramnios affecting pregnancy in third trimester 10/26/2022   History of gestational diabetes mellitus (GDM) 07/22/2022   Cleft lip and palate, fetal, affecting care of mother, antepartum 07/08/2022   Supervision of other normal pregnancy, antepartum 06/26/2022    Assessment/Plan:  PAMELLA SPRINKLE is a 21 y.o. G3P1011 at [redacted]w[redacted]d here for IOL for pre-eclampsia.  #Labor:Latent. S/p cytotec 50/25 given thick cervix. Will initiate pitocin when indicated. #Pain: Epidural when desired #FWB: Cat 1 #ID:  GBS neg #MOF: Breast #MOC: Copper IUD, post placental #Circ:  Yes, needs to be consented  Ethelene Hal, [place] for [place], [facility] 11/10/2022, 9:35 AM   Fellow Attestation  I saw and evaluated the patient, performing the key elements of the service.I  personally performed or re-performed the history, physical exam, and medical decision making  activities of this service and have verified that the service and findings are accurately documented in the resident's note. I developed the management plan that is described in the resident's note, and I agree with the content, with my edits above.   New diagnosis of pre-eclampsia in the office yesterday, based on 3+ proteinuria on dipstick and elevated blood pressures. Cr, CBC, LFTs on admission all look good. No severe range Blood pressures as of now and she is asymptomatic and in good spirits. PC ratio ordered today and pending.  Stormy Card, MD,MPH OB Fellow, Faculty Practice  11/10/2022 10:04 AM

## 2022-11-10 NOTE — Discharge Summary (Signed)
Postpartum Discharge Summary     Patient Name: Meredith Griffin DOB: 27-Jul-2001 MRN: [id number]  Date of admission: 11/10/2022 Delivery date:11/10/2022  Delivering provider: Shelda Pal  Date of discharge: 11/12/2022  Admitting diagnosis: Preeclampsia [O14.90] Intrauterine pregnancy: [redacted]w[redacted]d    Secondary diagnosis:  Principal Problem:   Vaginal delivery Active Problems:   Supervision of other normal pregnancy, antepartum   Cleft lip and palate, fetal, affecting care of mother, antepartum   Gestational hypertension   IUD (intrauterine device) in place   Encounter for insertion of ParaGard IUD  Additional problems: none    Discharge diagnosis: Term Pregnancy Delivered and Gestational Hypertension                                              Post partum procedures: IUD placed Augmentation: Pitocin and Cytotec Complications: None  Hospital course: Induction of Labor With Vaginal Delivery   21y.o. yo G3P1011 at 349w5das admitted to the hospital 11/10/2022 for induction of labor.  Indication for induction: Preeclampsia.  Patient had an labor course complicated by none. Membrane Rupture Time/Date: 5:16 PM ,11/10/2022   Delivery Method:Vaginal, Spontaneous  Episiotomy: None  Lacerations:  Periurethral  Details of delivery can be found in separate delivery note.  Patient had a postpartum course complicated by nothing. Patient is discharged home 11/12/22.  Newborn Data: Birth date:11/10/2022  Birth time:6:04 PM  Gender:Female  Living status:Living  Apgars:8 ,9  Weight:3300 g (7lb 4.4oz)  Magnesium Sulfate received: No BMZ received: No Rhophylac:N/A MMR:N/A T-DaP: prenatally given Flu: No Transfusion:No  Physical exam  Vitals:   11/11/22 0943 11/11/22 2126 11/12/22 0503 11/12/22 0859  BP: 124/84 (!) 128/93 106/76 (!) 133/97  Pulse: 87 98 94   Resp: _0 Temp: 98.3 F (36.8 C) 98.1 F (36.7 C) 98.2 F (36.8 C)   TempSrc: Oral Oral Oral   SpO2: 99% 95%  100%   Weight:      Height:       General: alert, cooperative, and no distress Lochia: appropriate Uterine Fundus: firm Incision: N/A DVT Evaluation: No evidence of DVT seen on physical exam. No cords or calf tenderness. 1+ nonpitting BLE edema  Labs: Lab Results  Component Value Date   WBC 12.0 (H) 11/11/2022   HGB 12.6 11/11/2022   HCT 37.2 11/11/2022   MCV 85.5 11/11/2022   PLT 197 11/11/2022      Latest Ref Rng & Units 11/10/2022    7:36 AM  CMP  Glucose 70 - 99 mg/dL 70   BUN 6 - 20 mg/dL 8   Creatinine 0.44 - 1.00 mg/dL 0.71   Sodium 135 - 145 mmol/L 135   Potassium 3.5 - 5.1 mmol/L 3.9   Chloride 98 - 111 mmol/L 108   CO2 22 - 32 mmol/L 18   Calcium 8.9 - 10.3 mg/dL 9.2   Total Protein 6.5 - 8.1 g/dL 6.1   Total Bilirubin 0.3 - 1.2 mg/dL 0.4   Alkaline Phos 38 - 126 U/L 152   AST 15 - 41 U/L 18   ALT 0 - 44 U/L 17    Edinburgh Score:    11/10/2022    8:45 PM  Edinburgh Postnatal Depression Scale Screening Tool  I have been able to laugh and see the funny side of things. 0  I have looked forward with enjoyment to  things. 0  I have blamed myself unnecessarily when things went wrong. 1  I have been anxious or worried for no good reason. 0  I have felt scared or panicky for no good reason. 0  Things have been getting on top of me. 0  I have been so unhappy that I have had difficulty sleeping. 0  I have felt sad or miserable. 0  I have been so unhappy that I have been crying. 0  The thought of harming myself has occurred to me. 0  Edinburgh Postnatal Depression Scale Total 1     After visit meds:  Allergies as of 11/12/2022       Reactions   Amoxil [amoxicillin] Rash        Medication List     TAKE these medications    acetaminophen 325 MG tablet Commonly known as: Tylenol Take 2 tablets (650 mg total) by mouth every 4 (four) hours as needed (for pain scale < 4).   benzocaine-Menthol 20-0.5 % Aero Commonly known as: DERMOPLAST Apply 1  Application topically as needed for irritation (perineal discomfort).   coconut oil Oil Apply 1 Application topically as needed.   furosemide 20 MG tablet Commonly known as: LASIX Take 1 tablet (20 mg total) by mouth 2 (two) times daily for 3 days.   ibuprofen 600 MG tablet Commonly known as: ADVIL Take 1 tablet (600 mg total) by mouth every 6 (six) hours.   NIFEdipine 30 MG 24 hr tablet Commonly known as: ADALAT CC Take 1 tablet (30 mg total) by mouth daily.   PRENATAL VITAMIN PO Take 1 tablet by mouth in the morning.   senna-docusate 8.6-50 MG tablet Commonly known as: Senokot-S Take 2 tablets by mouth daily.         Discharge home in stable condition Infant Feeding: Breast Infant Disposition:home with mother Discharge instruction: per After Visit Summary and Postpartum booklet. Activity: Advance as tolerated. Pelvic rest for 6 weeks.  Diet: routine diet Future Appointments: Future Appointments  Date Time Provider [place]  11/17/2022 10:10 AM [facility] NURSE [facility]  12/22/2022 11:30 AM Roma Schanz, CNM [facility]   Follow up Visit: Message sent 12/05 to [facility]  Please schedule this patient for a In person postpartum visit in 6 weeks with the following provider: Any provider. Additional Postpartum F/U:BP check 1 week  High risk pregnancy complicated by: HTN Delivery mode:  Vaginal, Spontaneous  Anticipated Birth Control:  PP IUD placed   11/12/2022 Myrtis Ser, CNM  CNM attestation I have seen and examined this patient and agree with above documentation in the resident's note.   Meredith Griffin is a 21 y.o. [id number] s/p vag del.   Pain is well controlled.  Plan for birth control is  Paragard IUD placed .  Method of Feeding: breast  PE:  BP (!) 133/97   Pulse 94   Temp 98.2 F (36.8 C) (Oral)   Resp 16   Ht _0  (1.549 m)   Wt 72.8 kg   SpO2 100%   Breastfeeding Unknown   BMI 30.33 kg/m  Fundus firm  Recent Labs     11/10/22 1905 11/11/22 0339  HGB 13.5 12.6  HCT 40.1 37.2     Plan: discharge today - postpartum care discussed - f/u clinic in 1wk for BP check, then in 6 weeks for postpartum visit and string trim prn  Myrtis Ser, CNM 9:32 AM 11/12/2022

## 2022-11-11 ENCOUNTER — Inpatient Hospital Stay (HOSPITAL_COMMUNITY): Payer: Medicaid Other | Admitting: Certified Registered Nurse Anesthetist

## 2022-11-11 LAB — CBC
HCT: 37.2 % (ref 36.0–46.0)
Hemoglobin: 12.6 g/dL (ref 12.0–15.0)
MCH: 29 pg (ref 26.0–34.0)
MCHC: 33.9 g/dL (ref 30.0–36.0)
MCV: 85.5 fL (ref 80.0–100.0)
Platelets: 197 10*3/uL (ref 150–400)
RBC: 4.35 MIL/uL (ref 3.87–5.11)
RDW: 13.2 % (ref 11.5–15.5)
WBC: 12 10*3/uL — ABNORMAL HIGH (ref 4.0–10.5)
nRBC: 0 % (ref 0.0–0.2)

## 2022-11-11 NOTE — Plan of Care (Signed)
Problem: Education: Goal: Knowledge of General Education information will improve Description: Including pain rating scale, medication(s)/side effects and non-pharmacologic comfort measures Outcome: Progressing   Problem: Health Behavior/Discharge Planning: Goal: Ability to manage health-related needs will improve Outcome: Progressing   Problem: Clinical Measurements: Goal: Ability to maintain clinical measurements within normal limits will improve Outcome: Progressing Goal: Will remain free from infection Outcome: Progressing Goal: Diagnostic test results will improve Outcome: Progressing Goal: Respiratory complications will improve Outcome: Progressing Goal: Cardiovascular complication will be avoided Outcome: Progressing   Problem: Activity: Goal: Risk for activity intolerance will decrease Outcome: Progressing   Problem: Nutrition: Goal: Adequate nutrition will be maintained Outcome: Progressing   Problem: Coping: Goal: Level of anxiety will decrease Outcome: Progressing   Problem: Elimination: Goal: Will not experience complications related to bowel motility Outcome: Progressing Goal: Will not experience complications related to urinary retention Outcome: Progressing   Problem: Pain Managment: Goal: General experience of comfort will improve Outcome: Progressing   Problem: Safety: Goal: Ability to remain free from injury will improve Outcome: Progressing   Problem: Skin Integrity: Goal: Risk for impaired skin integrity will decrease Outcome: Progressing   Problem: Education: Goal: Knowledge of Childbirth will improve Outcome: Progressing Goal: Ability to make informed decisions regarding treatment and plan of care will improve Outcome: Progressing Goal: Ability to state and carry out methods to decrease the pain will improve Outcome: Progressing Goal: Individualized Educational Video(s) Outcome: Progressing   Problem: Coping: Goal: Ability to  verbalize concerns and feelings about labor and delivery will improve Outcome: Progressing   Problem: Life Cycle: Goal: Ability to make normal progression through stages of labor will improve Outcome: Progressing Goal: Ability to effectively push during vaginal delivery will improve Outcome: Progressing   Problem: Role Relationship: Goal: Will demonstrate positive interactions with the child Outcome: Progressing   Problem: Safety: Goal: Risk of complications during labor and delivery will decrease Outcome: Progressing   Problem: Pain Management: Goal: Relief or control of pain from uterine contractions will improve Outcome: Progressing   Problem: Education: Goal: Knowledge of General Education information will improve Description: Including pain rating scale, medication(s)/side effects and non-pharmacologic comfort measures Outcome: Progressing   Problem: Health Behavior/Discharge Planning: Goal: Ability to manage health-related needs will improve Outcome: Progressing   Problem: Clinical Measurements: Goal: Ability to maintain clinical measurements within normal limits will improve Outcome: Progressing Goal: Will remain free from infection Outcome: Progressing Goal: Diagnostic test results will improve Outcome: Progressing Goal: Respiratory complications will improve Outcome: Progressing Goal: Cardiovascular complication will be avoided Outcome: Progressing   Problem: Activity: Goal: Risk for activity intolerance will decrease Outcome: Progressing   Problem: Nutrition: Goal: Adequate nutrition will be maintained Outcome: Progressing   Problem: Coping: Goal: Level of anxiety will decrease Outcome: Progressing   Problem: Elimination: Goal: Will not experience complications related to bowel motility Outcome: Progressing Goal: Will not experience complications related to urinary retention Outcome: Progressing   Problem: Pain Managment: Goal: General experience  of comfort will improve Outcome: Progressing   Problem: Safety: Goal: Ability to remain free from injury will improve Outcome: Progressing   Problem: Skin Integrity: Goal: Risk for impaired skin integrity will decrease Outcome: Progressing   Problem: Education: Goal: Knowledge of disease or condition will improve Outcome: Progressing Goal: Knowledge of the prescribed therapeutic regimen will improve Outcome: Progressing   Problem: Fluid Volume: Goal: Peripheral tissue perfusion will improve Outcome: Progressing   Problem: Clinical Measurements: Goal: Complications related to disease process, condition or treatment will be avoided or minimized  Outcome: Progressing   Problem: Education: Goal: Knowledge of condition will improve Outcome: Progressing Goal: Individualized Educational Video(s) Outcome: Progressing Goal: Individualized Newborn Educational Video(s) Outcome: Progressing

## 2022-11-11 NOTE — Anesthesia Postprocedure Evaluation (Signed)
Anesthesia Post Note  Patient: Meredith Griffin  Procedure(s) Performed: AN AD HOC LABOR EPIDURAL     Patient location during evaluation: Mother Baby Anesthesia Type: Epidural Level of consciousness: awake and alert Pain management: pain level controlled Vital Signs Assessment: post-procedure vital signs reviewed and stable Respiratory status: spontaneous breathing, nonlabored ventilation and respiratory function stable Cardiovascular status: stable Postop Assessment: no headache, no backache and epidural receding Anesthetic complications: no   No notable events documented.  Last Vitals:  Vitals:   11/11/22 0514 11/11/22 0634  BP: (!) 126/90 111/73  Pulse: 78   Resp:    Temp: 36.8 C   SpO2: 99%     Last Pain:  Vitals:   11/11/22 0208  TempSrc:   PainSc: 0-No pain   Pain Goal:                   Trellis Paganini

## 2022-11-11 NOTE — Lactation Note (Signed)
This note was copied from a baby's chart. Lactation Consultation Note  Patient Name: Meredith Griffin Date: 11/11/2022   Age:23 hours   LC  Note:  Attempted to visit with family, however, all members are asleep at this time.     Maternal Data    Feeding    LATCH Score                    Lactation Tools Discussed/Used    Interventions    Discharge    Consult Status      Emelynn Rance R Jayvyn Haselton 11/11/2022, 8:10 AM

## 2022-11-11 NOTE — Lactation Note (Signed)
This note was copied from a baby's chart. Lactation Consultation Note  Patient Name: Meredith Griffin [id number] Date: 11/11/2022 Reason for consult: Initial assessment Age:23 hours   P2: Early term infant at 37+5 weeks Feeding preference: Breast Weight loss: 2%  Baby has a cleft lip.  Mother had baby latched when I arrived; feeding for 15 minutes prior to my arrival.  He appeared to have a good latch, however, positioning and body alignment needed improvement.  Offered to assist; mother receptive.  Baby was also very sleepy and I suggested removing him from the breast to help awaken him would be beneficial.  Oral assessment completed; baby appears to have an intact palate and can easily grasp/suck on my gloved finger.    Mother prefers a modified side lying position.  Assisted her to latch more deeply with good body alignment and hand/finger placement.  Mother agreed this positioning was better.  Reviewed breast feeding basics and demonstrated gentle stimulation to keep him actively feeding.  Showed her intermittent breast compressions and baby was much more active with his sucking.  After an additional 11 minutes he released from the breast.  Placed him STS on mother's chest and he fell asleep.  Circumcision will be delayed until tomorrow so mother/baby can continue working on breast feeding.  Support person present and asleep on the couch.   Maternal Data Has patient been taught Hand Expression?: Yes Does the patient have breastfeeding experience prior to this delivery?: Yes  Feeding Mother's Current Feeding Choice: Breast Milk  LATCH Score Latch: Grasps breast easily, tongue down, lips flanged, rhythmical sucking.  Audible Swallowing: A few with stimulation  Type of Nipple: Everted at rest and after stimulation  Comfort (Breast/Nipple): Soft / non-tender  Hold (Positioning): No assistance needed to correctly position infant at breast.  LATCH Score: 9   Lactation Tools  Discussed/Used    Interventions Interventions: Breast feeding basics reviewed;Assisted with latch;Skin to skin;Breast massage;Hand express;Breast compression;Position options;Support pillows;Adjust position;Education;LC Services brochure  Discharge Pump: Personal  Consult Status Consult Status: Follow-up Date: 11/12/22 Follow-up type: In-patient    Nyla Creason R Meredith Griffin 11/11/2022, 10:06 AM

## 2022-11-11 NOTE — Lactation Note (Signed)
This note was copied from a baby's chart. Lactation Consultation Note  Patient Name: Meredith Griffin SHFWY'O Date: 11/11/2022   Age:23 hours Attempted to see mom but everyone was sleeping. RN stated the baby is BF very good.  Maternal Data    Feeding    LATCH Score Latch: Repeated attempts needed to sustain latch, nipple held in mouth throughout feeding, stimulation needed to elicit sucking reflex.  Audible Swallowing: A few with stimulation  Type of Nipple: Everted at rest and after stimulation  Comfort (Breast/Nipple): Soft / non-tender  Hold (Positioning): No assistance needed to correctly position infant at breast.  LATCH Score: 8   Lactation Tools Discussed/Used    Interventions    Discharge    Consult Status      Hattie Pine, Diamond Nickel 11/11/2022, 2:50 AM

## 2022-11-11 NOTE — Progress Notes (Signed)
POSTPARTUM PROGRESS NOTE  Post Partum Day 1  Subjective:  Meredith Griffin is a 21 y.o. [id number] s/p VD at [redacted]w[redacted]d.  She reports she is doing well. No acute events overnight. She denies any problems with ambulating, voiding or po intake. Denies nausea or vomiting.  Pain is well controlled.  Lochia is minimal.  Objective: Blood pressure 111/73, pulse 78, temperature 98.3 F (36.8 C), resp. rate 16, height 5\' 1"  (1.549 m), weight 72.8 kg, SpO2 99 %, unknown if currently breastfeeding.  Physical Exam:  General: alert, cooperative and no distress Chest: no respiratory distress Heart:regular rate, distal pulses intact Abdomen: soft, nontender,  Uterine Fundus: firm, appropriately tender DVT Evaluation: No calf swelling or tenderness Extremities: No LE edema Skin: warm, dry  Recent Labs    11/10/22 1905 11/11/22 0339  HGB 13.5 12.6  HCT 40.1 37.2    Assessment/Plan: Meredith Griffin is a 21 y.o. 30 s/p VD at [redacted]w[redacted]d   Hx of gHTN -Continue procardia and lasix  PPD#1 - Doing well  Routine postpartum care Contraception: ppIUD placed Feeding: Breast Dispo: Plan for discharge 12/7.   LOS: 1 day   14/7, DO OB Fellow, Faculty Practice [facility], [facility] 11/11/2022, 9:42 AM

## 2022-11-12 ENCOUNTER — Other Ambulatory Visit (HOSPITAL_COMMUNITY): Payer: Self-pay

## 2022-11-12 ENCOUNTER — Ambulatory Visit: Payer: Medicaid Other

## 2022-11-12 ENCOUNTER — Ambulatory Visit: Payer: Self-pay

## 2022-11-12 MED ORDER — ACETAMINOPHEN 325 MG PO TABS: 650.0000 mg | ORAL_TABLET | ORAL | Status: AC | PRN

## 2022-11-12 MED ORDER — NIFEDIPINE ER 30 MG PO TB24
30.0000 mg | ORAL_TABLET | Freq: Every day | ORAL | 0 refills | Status: DC
  Filled 2022-11-12: qty 30, 30d supply, fill #0

## 2022-11-12 MED ORDER — COCONUT OIL OIL: 1.0000 | TOPICAL_OIL | 0 refills | Status: AC | PRN

## 2022-11-12 MED ORDER — FUROSEMIDE 20 MG PO TABS
20.0000 mg | ORAL_TABLET | Freq: Two times a day (BID) | ORAL | 0 refills | Status: DC
  Filled 2022-11-12: qty 6, 3d supply, fill #0

## 2022-11-12 MED ORDER — BENZOCAINE-MENTHOL 20-0.5 % EX AERO
1.0000 | INHALATION_SPRAY | CUTANEOUS | 0 refills | Status: DC | PRN
  Filled 2022-11-12: qty 78, fill #0

## 2022-11-12 MED ORDER — IBUPROFEN 600 MG PO TABS
600.0000 mg | ORAL_TABLET | Freq: Four times a day (QID) | ORAL | 0 refills | Status: DC
  Filled 2022-11-12: qty 30, 8d supply, fill #0

## 2022-11-12 MED ORDER — SENNOSIDES-DOCUSATE SODIUM 8.6-50 MG PO TABS
2.0000 | ORAL_TABLET | ORAL | 0 refills | Status: AC
  Filled 2022-11-12: qty 60, 30d supply, fill #0

## 2022-11-12 NOTE — Lactation Note (Signed)
This note was copied from a baby's chart. Lactation Consultation Note  Patient Name: Meredith Griffin [id number] Date: 11/12/2022 Reason for consult: Follow-up assessment;Early term 37-38.6wks Age:23 hours  Maternal Data Has patient been taught Hand Expression?: Yes Does the patient have breastfeeding experience prior to this delivery?: Yes   P2: Early term infant at 37+5 weeks Feeding preference: Breast Weight loss: 8%  Baby has a cleft lip.  SLP consult completed yesterday.  Baby latched well in labor and delivery.  Mother feels like he was latching well until he started bottle feeding.  She remarked that he no longer gets a deep latch and she can feel nipple tenderness with his feedings.  She notices the latch to be "different."   Mother does not want to provide supplementation other than her expressed milk.  She has been pumping with the #24 flanges and has been obtaining approximately 20-30 mls every 3 hours.  Praised her for her efforts with pumping.  Since she just finished feeding 1/2 hour prior to my arrival I suggested she call for latch assistance at the next feeding so I can assist.    Reviewed pump parts and assembly with mother.  Made a "hands free" bra so she can massage and compress during pumping.  Mother very pleased with this and appreciative.  She will call for assistance with the next feeding.  RN updated.   Feeding Mother's Current Feeding Choice: Breast Milk  LATCH Score                    Lactation Tools Discussed/Used Tools: Pump;Flanges Flange Size: 24 Breast pump type: Double-Electric Breast Pump;Manual (Provided a belly band for a "hands free" bra) Pump Education: Setup, frequency, and cleaning;Milk Storage (Reviewed) Reason for Pumping: Breast stimulation for supplementation Pumping frequency: Every three hours  Interventions Interventions: Breast feeding basics reviewed;Education  Discharge Pump: Personal WIC Program: Yes  Consult  Status Consult Status: Follow-up Date: 11/13/22 Follow-up type: In-patient    Mercy Leppla R Odas Ozer 11/12/2022, 1:21 PM

## 2022-11-12 NOTE — Lactation Note (Signed)
This note was copied from a baby's chart. Lactation Consultation Note  Patient Name: Meredith Griffin Date: 11/12/2022 Reason for consult: Follow-up assessment;Early term 37-38.6wks Age:23 hours  LC Follow Up Note:  Mother requested latch assistance as discussed in our morning consult. Weight loss: 8%.  Mother had baby latched when I arrived, however, she felt uncertain as to whether or not he was latched correctly; appeared to have a shallow latch and suggested she remove him from the breast and latch again.  Upon removal, her nipple was flattened.  Education completed on how to obtain a deep latch.  Assisted to latch in the cross cradle hold and he eagerly began sucking.  Mother stated this latch was much more comfortable.  Reviewed positioning and body alignment.  Demonstrated breast compressions and gentle stimulation.  Observed baby feeding for 15 minutes before he self released.  Mother's nipple rounded; encouraged colostrum drops for comfort.  Worked with father and showed him how to assist with preparing the supplementation.  Baby is using the Dr. Theora Gianotti bottle per SLP recommendation.  Suggested 30+ mls after breast feeding.  Due to weight loss, asked parents to supplement after every feeding.  Mother verbalized understanding.  RN has ordered a full bottle of donor breast milk to be delivered later today.   Mother will continue to pump after every feeding.  She is averaging approximately 20 mls per pumping session.  Reviewed milk storage times.  Mother will always feed her breast milk before giving donor milk and will not mix her milk with the donor milk.  Parents have no further questions regarding the feeding plan.  RN updated.    Maternal Data Has patient been taught Hand Expression?: Yes Does the patient have breastfeeding experience prior to this delivery?: Yes  Feeding Mother's Current Feeding Choice: Breast Milk and Donor Milk  LATCH Score Latch: Repeated  attempts needed to sustain latch, nipple held in mouth throughout feeding, stimulation needed to elicit sucking reflex.  Audible Swallowing: A few with stimulation  Type of Nipple: Everted at rest and after stimulation  Comfort (Breast/Nipple): Soft / non-tender  Hold (Positioning): Assistance needed to correctly position infant at breast and maintain latch.  LATCH Score: 7   Lactation Tools Discussed/Used Tools: Pump;Flanges Flange Size: 24 Breast pump type: Double-Electric Breast Pump;Manual Pump Education: Setup, frequency, and cleaning;Milk Storage (Reviewed) Reason for Pumping: Breast stimulation for supplementation Pumping frequency: Every three hours Pumped volume: 20 mL  Interventions Interventions: Breast feeding basics reviewed;Assisted with latch;Skin to skin;Breast massage;Hand express;Breast compression;Position options;Support pillows;Adjust position;DEBP;Education  Discharge Pump: Personal WIC Program: Yes  Consult Status Consult Status: Follow-up Date: 11/13/22 Follow-up type: In-patient    Meredith Griffin R Meredith Griffin 11/12/2022, 4:16 PM

## 2022-11-13 ENCOUNTER — Ambulatory Visit: Payer: Self-pay

## 2022-11-13 NOTE — Lactation Note (Signed)
This note was copied from a baby's chart. 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  Baby is at a 8.3% weight loss, a loss of only 20 gms from yesterday.  Mom reports baby has breastfeeding and then she offers donor breast milk by paced bottle.  SLP consulted and Mom feels comfortable with bottle feeding.  Mom does not have a DEBP at home.  [facility] referral faxed and talked with [facility] office.  Parents told of [facility] loaner option if [facility] can not provide a pump today.   Mom just double pumped for 20 mins and expressed 120 ml.  Storage guidelines reviewed. Mom given several storage bottles.  NO MORE DONOR MILK OR FORMULA NEEDED!  Mom describes difficulty latching baby to the breast without an extra hand to help with positioning and seal around breast.  Mom will call for lactation help at next feeding.   Lactation Tools Discussed/Used Tools: Pump;Flanges;Bottle;Hands-free pumping top Flange Size: 24 Breast pump type: Double-Electric Breast Pump Pump Education: Setup, frequency, and cleaning;Milk Storage Reason for Pumping: Supporting milk supply/Cleft lip and partial palate Pumping frequency: Encouraged to pump every 2-3 hrs during the day and 3-4 hrs at night Pumped volume: 120 mL Discharge Discharge Education: Engorgement and breast care;Warning signs for feeding baby;Outpatient recommendation;Outpatient Epic message sent Pump: [facility] Loaner [facility] Program: Yes  Consult Status Consult Status: Follow-up Date: 11/14/22 Follow-up type: In-patient    Meredith Griffin 11/13/2022, 9:42 AM

## 2022-11-13 NOTE — Lactation Note (Signed)
This note was copied from a baby's chart. Lactation Consultation Note  Patient Name: Meredith Griffin [id number] Date: 11/13/2022 Reason for consult: Follow-up assessment;Difficult latch;Early term 37-38.6wks Age:23 hours  Mom called requesting LC.  Baby on the breast in cross cradle hold.  LC added pillow under baby for support.  Baby's latch was wide and deep.  Baby was sucking with deep jaw extensions and swallows identified.  Praised Mom for suck a good latch and feeding.  Talked about the benefit of OP lactation F/U and the importance of knowing how much the baby can transfer at the breast.  Talked about pumping and creating an over supply if baby is also feeding well.  Message sent to clinic and Mom is agreeable to this.   LATCH Score Latch: Grasps breast easily, tongue down, lips flanged, rhythmical sucking.  Audible Swallowing: Spontaneous and intermittent  Type of Nipple: Everted at rest and after stimulation  Comfort (Breast/Nipple): Soft / non-tender  Hold (Positioning): No assistance needed to correctly position infant at breast.  LATCH Score: 10   Lactation Tools Discussed/Used Tools: Pump;Flanges;Bottle;Hands-free pumping top Flange Size: 24 Breast pump type: Double-Electric Breast Pump Pump Education: Setup, frequency, and cleaning;Milk Storage Reason for Pumping: Supporting milk supply/Cleft lip and partial palate Pumping frequency: Encouraged to pump every 2-3 hrs during the day and 3-4 hrs at night Pumped volume: 120 mL  Interventions Interventions: Breast feeding basics reviewed;Skin to skin;Breast massage;Hand express;DEBP;Hand pump;Education;Pace feeding  Discharge Discharge Education: Engorgement and breast care;Warning signs for feeding baby;Outpatient recommendation;Outpatient Epic message sent Pump: [facility] Program: Yes  Consult Status Consult Status: Complete Date: 11/13/22 Follow-up type: Out-patient    Meredith Griffin 11/13/2022,  12:13 PM

## 2022-11-16 ENCOUNTER — Encounter: Payer: Medicaid Other | Admitting: Advanced Practice Midwife

## 2022-11-17 ENCOUNTER — Ambulatory Visit: Payer: Medicaid Other

## 2022-11-18 ENCOUNTER — Telehealth (HOSPITAL_COMMUNITY): Payer: Self-pay | Admitting: *Deleted

## 2022-11-18 NOTE — Telephone Encounter (Signed)
No voicemail set up.  Duffy Rhody, RN 11-18-2022 at 9:50am

## 2022-11-23 ENCOUNTER — Encounter: Payer: Medicaid Other | Admitting: Women's Health

## 2022-11-24 ENCOUNTER — Ambulatory Visit: Payer: Medicaid Other

## 2022-12-22 ENCOUNTER — Encounter: Payer: Self-pay | Admitting: Women's Health

## 2022-12-22 ENCOUNTER — Ambulatory Visit (INDEPENDENT_AMBULATORY_CARE_PROVIDER_SITE_OTHER): Payer: Medicaid Other | Admitting: Women's Health

## 2022-12-22 DIAGNOSIS — Z8279 Family history of other congenital malformations, deformations and chromosomal abnormalities: Secondary | ICD-10-CM

## 2022-12-22 DIAGNOSIS — R8290 Unspecified abnormal findings in urine: Secondary | ICD-10-CM

## 2022-12-22 DIAGNOSIS — Z8759 Personal history of other complications of pregnancy, childbirth and the puerperium: Secondary | ICD-10-CM

## 2022-12-22 LAB — POCT URINALYSIS DIPSTICK
Glucose, UA: NEGATIVE
Ketones, UA: NEGATIVE
Nitrite, UA: NEGATIVE
Protein, UA: NEGATIVE

## 2022-12-22 NOTE — Progress Notes (Signed)
POSTPARTUM VISIT Patient name: Meredith Griffin MRN [id number]  Date of birth: Jul 29, 2001 Chief Complaint:   Postpartum Care (Paragard IUD placed in hosp.)  History of Present Illness:   Meredith Griffin is a 22 y.o. G63P2012 Caucasian female being seen today for a postpartum visit. She is 6 weeks postpartum following a spontaneous vaginal delivery at 37.5 gestational weeks. IOL: yes, for GHTN. Anesthesia: epidural.  Laceration: periurethral.  Complications: none. Inpatient contraception: yes postplacental Paragard inserted 11/10/22 .   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 Tobacco use: yes. Substance use disorder: no. Last pap smear: 06/24/22 and results were NILM w/ HRHPV not done. Next pap smear due: 2026 No LMP recorded.  Postpartum course has been complicated by dark urine and odor to urine. Bleeding spotting. Bowel function is normal. Bladder function is normal. Urinary incontinence? no, fecal incontinence? no Patient is sexually active. Last sexual activity:  last night . Desired contraception: PP IUD placed. Patient does not want a pregnancy in the future.  Desired family size is 2 children.   Upstream - 12/22/22 1139       Pregnancy Intention Screening   Does the patient want to become pregnant in the next year? No    Does the patient's partner want to become pregnant in the next year? No    Would the patient like to discuss contraceptive options today? No      Contraception Wrap Up   Current Method IUD or IUS    End Method IUD or IUS            The pregnancy intention screening data noted above was reviewed. Potential methods of contraception were discussed. The patient elected to proceed with IUD or IUS.  Edinburgh Postpartum Depression Screening: negative, doesn't feel depressed or anxious, just decreased motivation.   Edinburgh Postnatal Depression Scale - 12/22/22 1139       Edinburgh Postnatal Depression Scale:  In the Past 7  Days   I have been able to laugh and see the funny side of things. 0    I have looked forward with enjoyment to things. 1    I have blamed myself unnecessarily when things went wrong. 2    I have been anxious or worried for no good reason. 2    I have felt scared or panicky for no good reason. 1    Things have been getting on top of me. 1    I have been so unhappy that I have had difficulty sleeping. 0    I have felt sad or miserable. 0    I have been so unhappy that I have been crying. 0    The thought of harming myself has occurred to me. 0    Edinburgh Postnatal Depression Scale Total 7                09/14/2022   11:08 AM 06/24/2022   10:05 AM  GAD 7 : Generalized Anxiety Score  Nervous, Anxious, on Edge 0 0  Control/stop worrying 0 0  Worry too much - different things 0 0  Trouble relaxing 0 0  Restless 0 0  Easily annoyed or irritable 0 0  Afraid - awful might happen 0 0  Total GAD 7 Score 0 0     Baby's course has been complicated by cleft lip/palate, is mild, and able to latch. [facility] app[facility] at [facility] coming up. Baby is feeding by breast and bottle: milk  supply adequate. Infant has a pediatrician/family doctor? Yes.  Childcare strategy if returning to work/school: n/a-stay at home mom.  Pt has material needs met for her and baby: Yes.   Review of Systems:   Pertinent items are noted in HPI Denies Abnormal vaginal discharge w/ itching/odor/irritation, headaches, visual changes, shortness of breath, chest pain, abdominal pain, severe nausea/vomiting, or problems with urination or bowel movements. Pertinent History Reviewed:  Reviewed past medical,surgical, obstetrical and family history.  Reviewed problem list, medications and allergies. OB History  Gravida Para Term Preterm AB Living  3 2 2  0 1 2  SAB IAB Ectopic Multiple Live Births  1 0 0 0 2    # Outcome Date GA Lbr Len/2nd Weight Sex Delivery Anes PTL Lv  3 Term 11/10/22 [redacted]w[redacted]d 04:49 / 00:15 7 lb 4.4 oz (3.3 kg) M  Vag-Spont EPI  LIV  2 Term 08/31/21 [redacted]w[redacted]d    Vag-Spont  N LIV  1 SAB            Physical Assessment:   Vitals:   12/22/22 1135  BP: 113/78  Pulse: 90  Weight: 133 lb 8 oz (60.6 kg)  Height: 5\' 2"  (1.575 m)  Body mass index is 24.42 kg/m.       Physical Examination:   General appearance: alert, well appearing, and in no distress  Mental status: alert, oriented to person, place, and time  Skin: warm & dry   Cardiovascular: normal heart rate noted   Respiratory: normal respiratory effort, no distress   Breasts: deferred, no complaints   Abdomen: soft, non-tender   Pelvic: VULVA: normal appearing vulva with no masses, tenderness or lesions, VAGINA: normal appearing vagina with normal color and discharge, no lesions, CERVIX: normal appearing cervix without discharge or lesions, IUD strings long & curled up at cervix- trimmed. Thin prep pap obtained: No  Rectal: not examined  Extremities: Edema: none   Chaperone: 12/24/22         Results for orders placed or performed in visit on 12/22/22 (from the past 24 hour(s))  POCT Urinalysis Dipstick   Collection Time: 12/22/22 11:50 AM  Result Value Ref Range   Color, UA     Clarity, UA     Glucose, UA Negative Negative   Bilirubin, UA     Ketones, UA neg    Spec Grav, UA     Blood, UA trace    pH, UA     Protein, UA Negative Negative   Urobilinogen, UA     Nitrite, UA neg    Leukocytes, UA Trace (A) Negative   Appearance     Odor      Assessment & Plan:  1) Postpartum exam 2) 6 wks s/p spontaneous vaginal delivery after IOL for GHTN 3) breast & bottle feeding 4) Depression screening 5) Contraception s/p Paragard IUD insertion 6) Dark urine w/ odor> will send cx  Essential components of care per ACOG recommendations:  1.  Mood and well being:  If positive depression screen, discussed and plan developed.  If using tobacco we discussed reduction/cessation and risk of relapse If current substance abuse, we discussed and  referral to local resources was offered.   2. Infant care and feeding:  If breastfeeding, discussed returning to work, pumping, breastfeeding-associated pain, guidance regarding return to fertility while lactating if not using another method. If needed, patient was provided with a letter to be allowed to pump q 2-3hrs to support lactation in a private location with access to  a refrigerator to store breastmilk.   Recommended that all caregivers be immunized for flu, pertussis and other preventable communicable diseases If pt does not have material needs met for her/baby, referred to local resources for help obtaining these.  3. Sexuality, contraception and birth spacing Provided guidance regarding sexuality, management of dyspareunia, and resumption of intercourse Discussed avoiding interpregnancy interval <72mths and recommended birth spacing of 18 months  4. Sleep and fatigue Discussed coping options for fatigue and sleep disruption Encouraged family/partner/community support of 4 hrs of uninterrupted sleep to help with mood and fatigue  5. Physical recovery  If pt had a C/S, assessed incisional pain and providing guidance on normal vs prolonged recovery If pt had a laceration, perineal healing and pain reviewed.  If urinary or fecal incontinence, discussed management and referred to PT or uro/gyn if indicated  Patient is safe to resume physical activity. Discussed attainment of healthy weight.  6.  Chronic disease management Discussed pregnancy complications if any, and their implications for future childbearing and long-term maternal health. Review recommendations for prevention of recurrent pregnancy complications, such as 17 hydroxyprogesterone caproate to reduce risk for recurrent PTB not applicable, or aspirin to reduce risk of preeclampsia yes. Pt had GDM: no. If yes, 2hr GTT scheduled: not applicable. Reviewed medications and non-pregnant dosing including consideration of whether pt  is breastfeeding using a reliable resource such as [employer]: not applicable Referred for f/u w/ PCP or subspecialist providers as indicated: not applicable  7. Health maintenance Mammogram at 24yo or earlier if indicated Pap smears as indicated  Meds: No orders of the defined types were placed in this encounter.   Follow-up: Return in about 1 year (around 12/23/2023) for Physical.   Orders Placed This Encounter  Procedures   Urine Culture   POCT Urinalysis Dipstick    Roma Schanz CNM, [facility] 12/22/2022 12:45 PM

## 2022-12-24 LAB — URINE CULTURE

## 2022-12-28 MED ORDER — NITROFURANTOIN MONOHYD MACRO 100 MG PO CAPS: 100.0000 mg | ORAL_CAPSULE | Freq: Two times a day (BID) | ORAL | 0 refills | Status: AC

## 2022-12-28 NOTE — Addendum Note (Signed)
Addended by: Roma Schanz on: 12/28/2022 12:37 PM   Modules accepted: Orders

## 2023-07-06 ENCOUNTER — Ambulatory Visit: Payer: Medicaid Other | Admitting: Obstetrics & Gynecology
